# Patient Record
Sex: Female | Born: 1995 | Race: White | Hispanic: No | Marital: Single | Smoking: Never smoker
Health system: Southern US, Community
[De-identification: ages and names within clinical notes are randomized; demographics above are authoritative.]

## PROBLEM LIST (undated history)

## (undated) DIAGNOSIS — F329 Major depressive disorder, single episode, unspecified: Secondary | ICD-10-CM

## (undated) DIAGNOSIS — F32A Depression, unspecified: Secondary | ICD-10-CM

## (undated) HISTORY — DX: Major depressive disorder, single episode, unspecified: F32.9

## (undated) HISTORY — PX: WISDOM TOOTH EXTRACTION: SHX21

## (undated) HISTORY — DX: Depression, unspecified: F32.A

---

## 2017-06-01 ENCOUNTER — Encounter: Payer: Self-pay | Admitting: Obstetrics and Gynecology

## 2017-06-01 ENCOUNTER — Ambulatory Visit (INDEPENDENT_AMBULATORY_CARE_PROVIDER_SITE_OTHER): Payer: 59 | Admitting: Obstetrics and Gynecology

## 2017-06-01 ENCOUNTER — Other Ambulatory Visit (HOSPITAL_COMMUNITY)
Admission: RE | Admit: 2017-06-01 | Discharge: 2017-06-01 | Disposition: A | Discharge status: Home / Self Care | Payer: 59 | Source: Ambulatory Visit | Attending: Obstetrics and Gynecology | Admitting: Obstetrics and Gynecology

## 2017-06-01 VITALS — BP 118/60 | HR 100 | Resp 14 | Ht 62.5 in | Wt 118.0 lb

## 2017-06-01 DIAGNOSIS — Z01419 Encounter for gynecological examination (general) (routine) without abnormal findings: Secondary | ICD-10-CM

## 2017-06-01 DIAGNOSIS — Z23 Encounter for immunization: Secondary | ICD-10-CM | POA: Diagnosis not present

## 2017-06-01 DIAGNOSIS — N644 Mastodynia: Secondary | ICD-10-CM

## 2017-06-01 DIAGNOSIS — Z124 Encounter for screening for malignant neoplasm of cervix: Secondary | ICD-10-CM | POA: Insufficient documentation

## 2017-06-01 DIAGNOSIS — N6320 Unspecified lump in the left breast, unspecified quadrant: Secondary | ICD-10-CM

## 2017-06-01 DIAGNOSIS — Z113 Encounter for screening for infections with a predominantly sexual mode of transmission: Secondary | ICD-10-CM | POA: Diagnosis not present

## 2017-06-01 DIAGNOSIS — N6325 Unspecified lump in the left breast, overlapping quadrants: Secondary | ICD-10-CM

## 2017-06-01 DIAGNOSIS — Z0000 Encounter for general adult medical examination without abnormal findings: Secondary | ICD-10-CM | POA: Diagnosis not present

## 2017-06-01 NOTE — Progress Notes (Signed)
21 y.o. G0P0000 SingleCaucasianF here for annual exam.  She was previously on OCP's, did okay on it.  Period Cycle (Days): 28 Period Duration (Days): 4 days  Period Pattern: Regular Menstrual Flow: Light Menstrual Control: Tampon, Thin pad, Maxi pad Menstrual Control Change Freq (Hours): changes pad/ tampon 3 times a day  Dysmenorrhea: None  Sexually active, not currently. No dyspareunia.  She c/o a one month h/o intermittent pain in her left breast.   Patient's last menstrual period was 05/18/2017.          Sexually active: Yes.    The current method of family planning is none.    Exercising: Yes.    yoga and cardio  Smoker:  no  Health Maintenance: Pap:  Never TDaP:  2008 Gardasil: completed all 3    reports that she has never smoked. She has never used smokeless tobacco. She reports that she drinks about 1.8 - 3.0 oz of alcohol per week . She reports that she does not use drugs. 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  Past Medical History:  Diagnosis Date  . Depression     No past surgical history on file.  No current outpatient prescriptions on file.   No current facility-administered medications for this visit.     No family history on file.  Review of Systems  Constitutional: Negative.   HENT: Negative.   Eyes: Negative.   Respiratory: Negative.   Cardiovascular: Negative.   Gastrointestinal: Negative.   Endocrine: Negative.   Genitourinary: Negative.   Musculoskeletal: Negative.   Skin: Negative.   Allergic/Immunologic: Negative.   Neurological: Negative.   Psychiatric/Behavioral: Negative.     Exam:   BP 118/60 (BP Location: Right Arm, Patient Position: Sitting, Cuff Size: Normal)   Pulse 100   Resp 14   Ht 5' 2.5" (1.588 m)   Wt 118 lb (53.5 kg)   LMP 05/18/2017   BMI 21.24 kg/m   Weight change: @ Height:   Height: 5' 2.5" (158.8 cm)  Ht Readings from Last 3 Encounters:   06/01/17 5' 2.5" (1.588 m)    General appearance: alert, cooperative and appears stated age Head: Normocephalic, without obvious abnormality, atraumatic Neck: no adenopathy, supple, symmetrical, trachea midline and thyroid normal to inspection and palpation Lungs: clear to auscultation bilaterally Cardiovascular: regular rate and rhythm Breasts: in the left breast at 12 o'clock near the periphery is an increased area of nodulartity/lump. Otherwise normal exam Abdomen: soft, non-tender; non distended,  no masses,  no organomegaly Extremities: extremities normal, atraumatic, no cyanosis or edema Skin: Skin color, texture, turgor normal. No rashes or lesions Lymph nodes: Cervical, supraclavicular, and axillary nodes normal. No abnormal inguinal nodes palpated Neurologic: Grossly normal   Pelvic: External genitalia:  no lesions              Urethra:  normal appearing urethra with no masses, tenderness or lesions              Bartholins and Skenes: normal                 Vagina: normal appearing vagina with normal color and discharge, no lesions              Cervix: no lesions               Bimanual Exam:  Uterus:  normal size, contour, position, consistency, mobility, non-tender  Adnexa: no mass, fullness, tenderness               Rectovaginal: Confirms               Anus:  normal sphincter tone, no lesions  Chaperone was present for exam.  A:  Well Woman with normal exam  Left breast pain and nodularity at 12 o'clock    P:   She will call if she wants to start OCP's, if she call's will call in loestrin 1/20 (no contraindications, risks reviewed)  Discussed other options of contraception  Condoms  TDAP today  Screening labs   Screening STD  Pap with GC/CT  Ultrasound left breast 12 o'clock

## 2017-06-01 NOTE — Patient Instructions (Addendum)
EXERCISE AND DIET:  We recommended that you start or continue a regular exercise program for good health. Regular exercise means any activity that makes your heart beat faster and makes you sweat.  We recommend exercising at least 30 minutes per day at least 3 days a week, preferably 4 or 5.  We also recommend a diet low in fat and sugar.  Inactivity, poor dietary choices and obesity can cause diabetes, heart attack, stroke, and kidney damage, among others.    ALCOHOL AND SMOKING:  Women should limit their alcohol intake to no more than 7 drinks/beers/glasses of wine (combined, not each!) per week. Moderation of alcohol intake to this level decreases your risk of breast cancer and liver damage. And of course, no recreational drugs are part of a healthy lifestyle.  And absolutely no smoking or even second hand smoke. Most people know smoking can cause heart and lung diseases, but did you know it also contributes to weakening of your bones? Aging of your skin?  Yellowing of your teeth and nails?  CALCIUM AND VITAMIN D:  Adequate intake of calcium and Vitamin D are recommended.  The recommendations for exact amounts of these supplements seem to change often, but generally speaking 600 mg of calcium (either carbonate or citrate) and 800 units of Vitamin D per day seems prudent. Certain women may benefit from higher intake of Vitamin D.  If you are among these women, your doctor will have told you during your visit.    PAP SMEARS:  Pap smears, to check for cervical cancer or precancers,  have traditionally been done yearly, although recent scientific advances have shown that most women can have pap smears less often.  However, every woman still should have a physical exam from her gynecologist every year. It will include a breast check, inspection of the vulva and vagina to check for abnormal growths or skin changes, a visual exam of the cervix, and then an exam to evaluate the size and shape of the uterus and  ovaries.  And after 22 years of age, a rectal exam is indicated to check for rectal cancers. We will also provide age appropriate advice regarding health maintenance, like when you should have certain vaccines, screening for sexually transmitted diseases, bone density testing, colonoscopy, mammograms, etc.   MAMMOGRAMS:  All women over 22 years old should have a yearly mammogram. Many facilities now offer a "3D" mammogram, which may cost around $50 extra out of pocket. If possible,  we recommend you accept the option to have the 3D mammogram performed.  It both reduces the number of women who will be called back for extra views which then turn out to be normal, and it is better than the routine mammogram at detecting truly abnormal areas.    COLONOSCOPY:  Colonoscopy to screen for colon cancer is recommended for all women at age 22.  We know, you hate the idea of the prep.  We agree, BUT, having colon cancer and not knowing it is worse!!  Colon cancer so often starts as a polyp that can be seen and removed at colonscopy, which can quite literally save your life!  And if your first colonoscopy is normal and you have no family history of colon cancer, most women don't have to have it again for 10 years.  Once every ten years, you can do something that may end up saving your life, right?  We will be happy to help you get it scheduled when you are ready.    Be sure to check your insurance coverage so you understand how much it will cost.  It may be covered as a preventative service at no cost, but you should check your particular policy.      Breast Self-Awareness Breast self-awareness means being familiar with how your breasts look and feel. It involves checking your breasts regularly and reporting any changes to your health care provider. Practicing breast self-awareness is important. A change in your breasts can be a sign of a serious medical problem. Being familiar with how your breasts look and feel allows  you to find any problems early, when treatment is more likely to be successful. All women should practice breast self-awareness, including women who have had breast implants. How to do a breast self-exam One way to learn what is normal for your breasts and whether your breasts are changing is to do a breast self-exam. To do a breast self-exam: Look for Changes  1. Remove all the clothing above your waist. 2. Stand in front of a mirror in a room with good lighting. 3. Put your hands on your hips. 4. Push your hands firmly downward. 5. Compare your breasts in the mirror. Look for differences between them (asymmetry), such as: ? Differences in shape. ? Differences in size. ? Puckers, dips, and bumps in one breast and not the other. 6. Look at each breast for changes in your skin, such as: ? Redness. ? Scaly areas. 7. Look for changes in your nipples, such as: ? Discharge. ? Bleeding. ? Dimpling. ? Redness. ? A change in position. Feel for Changes  Carefully feel your breasts for lumps and changes. It is best to do this while lying on your back on the floor and again while sitting or standing in the shower or tub with soapy water on your skin. Feel each breast in the following way:  Place the arm on the side of the breast you are examining above your head.  Feel your breast with the other hand.  Start in the nipple area and make  inch (2 cm) overlapping circles to feel your breast. Use the pads of your three middle fingers to do this. Apply light pressure, then medium pressure, then firm pressure. The light pressure will allow you to feel the tissue closest to the skin. The medium pressure will allow you to feel the tissue that is a little deeper. The firm pressure will allow you to feel the tissue close to the ribs.  Continue the overlapping circles, moving downward over the breast until you feel your ribs below your breast.  Move one finger-width toward the center of the body.  Continue to use the  inch (2 cm) overlapping circles to feel your breast as you move slowly up toward your collarbone.  Continue the up and down exam using all three pressures until you reach your armpit.  Write Down What You Find  Write down what is normal for each breast and any changes that you find. Keep a written record with breast changes or normal findings for each breast. By writing this information down, you do not need to depend only on memory for size, tenderness, or location. Write down where you are in your menstrual cycle, if you are still menstruating. If you are having trouble noticing differences in your breasts, do not get discouraged. With time you will become more familiar with the variations in your breasts and more comfortable with the exam. How often should I examine my breasts? Examine   your breasts every month. If you are breastfeeding, the best time to examine your breasts is after a feeding or after using a breast pump. If you menstruate, the best time to examine your breasts is 5-7 days after your period is over. During your period, your breasts are lumpier, and it may be more difficult to notice changes. When should I see my health care provider? See your health care provider if you notice:  A change in shape or size of your breasts or nipples.  A change in the skin of your breast or nipples, such as a reddened or scaly area.  Unusual discharge from your nipples.  A lump or thick area that was not there before.  Pain in your breasts.  Anything that concerns you.  This information is not intended to replace advice given to you by your health care provider. Make sure you discuss any questions you have with your health care provider. Document Released: 08/16/2005 Document Revised: 01/22/2016 Document Reviewed: 07/06/2015 Elsevier Interactive Patient Education  2018 Elsevier Inc.  Oral Contraception Information Oral contraceptive pills (OCPs) are medicines  taken to prevent pregnancy. OCPs work by preventing the ovaries from releasing eggs. The hormones in OCPs also cause the cervical mucus to thicken, preventing the sperm from entering the uterus. The hormones also cause the uterine lining to become thin, not allowing a fertilized egg to attach to the inside of the uterus. OCPs are highly effective when taken exactly as prescribed. However, OCPs do not prevent sexually transmitted diseases (STDs). Safe sex practices, such as using condoms along with the pill, can help prevent STDs. Before taking the pill, you may have a physical exam and Pap test. Your health care provider may order blood tests. The health care provider will make sure you are a good candidate for oral contraception. Discuss with your health care provider the possible side effects of the OCP you may be prescribed. When starting an OCP, it can take 2 to 3 months for the body to adjust to the changes in hormone levels in your body. Types of oral contraception  The combination pill-This pill contains estrogen and progestin (synthetic progesterone) hormones. The combination pill comes in 21-day, 28-day, or 91-day packs. Some types of combination pills are meant to be taken continuously (365-day pills). With 21-day packs, you do not take pills for 7 days after the last pill. With 28-day packs, the pill is taken every day. The last 7 pills are without hormones. Certain types of pills have more than 21 hormone-containing pills. With 91-day packs, the first 84 pills contain both hormones, and the last 7 pills contain no hormones or contain estrogen only.  The minipill-This pill contains the progesterone hormone only. The pill is taken every day continuously. It is very important to take the pill at the same time each day. The minipill comes in packs of 28 pills. All 28 pills contain the hormone. Advantages of oral contraceptive pills  Decreases premenstrual symptoms.  Treats menstrual period  cramps.  Regulates the menstrual cycle.  Decreases a heavy menstrual flow.  May treatacne, depending on the type of pill.  Treats abnormal uterine bleeding.  Treats polycystic ovarian syndrome.  Treats endometriosis.  Can be used as emergency contraception. Things that can make oral contraceptive pills less effective OCPs can be less effective if:  You forget to take the pill at the same time every day.  You have a stomach or intestinal disease that lessens the absorption of the pill.    You take OCPs with other medicines that make OCPs less effective, such as antibiotics, certain HIV medicines, and some seizure medicines.  You take expired OCPs.  You forget to restart the pill on day 7, when using the packs of 21 pills.  Risks associated with oral contraceptive pills Oral contraceptive pills can sometimes cause side effects, such as:  Headache.  Nausea.  Breast tenderness.  Irregular bleeding or spotting.  Combination pills are also associated with a small increased risk of:  Blood clots.  Heart attack.  Stroke.  This information is not intended to replace advice given to you by your health care provider. Make sure you discuss any questions you have with your health care provider. Document Released: 11/06/2002 Document Revised: 01/22/2016 Document Reviewed: 02/04/2013 Elsevier Interactive Patient Education  2018 Elsevier Inc.  

## 2017-06-01 NOTE — Progress Notes (Signed)
Patient scheduled while in office for left breast US. Spoke with Fabby at [facility] Scheduled for 06/08/17 arriving at 7:50am for 8:10am appointment. Patient verbalizes understanding and is agreeable to date and time.

## 2017-06-02 LAB — CYTOLOGY - PAP
CHLAMYDIA, DNA PROBE: NEGATIVE
Diagnosis: NEGATIVE
NEISSERIA GONORRHEA: NEGATIVE

## 2017-06-02 LAB — CBC
HEMATOCRIT: 43.4 % (ref 34.0–46.6)
Hemoglobin: 14.3 g/dL (ref 11.1–15.9)
MCH: 30.4 pg (ref 26.6–33.0)
MCHC: 32.9 g/dL (ref 31.5–35.7)
MCV: 92 fL (ref 79–97)
Platelets: 285 10*3/uL (ref 150–379)
RBC: 4.7 x10E6/uL (ref 3.77–5.28)
RDW: 12.6 % (ref 12.3–15.4)
WBC: 6.7 10*3/uL (ref 3.4–10.8)

## 2017-06-02 LAB — COMPREHENSIVE METABOLIC PANEL
A/G RATIO: 2 (ref 1.2–2.2)
ALT: 10 IU/L (ref 0–32)
AST: 19 IU/L (ref 0–40)
Albumin: 5 g/dL (ref 3.5–5.5)
Alkaline Phosphatase: 56 IU/L (ref 39–117)
BUN/Creatinine Ratio: 12 (ref 9–23)
BUN: 8 mg/dL (ref 6–20)
Bilirubin Total: 0.7 mg/dL (ref 0.0–1.2)
CALCIUM: 10.2 mg/dL (ref 8.7–10.2)
CO2: 24 mmol/L (ref 20–29)
Chloride: 103 mmol/L (ref 96–106)
Creatinine, Ser: 0.69 mg/dL (ref 0.57–1.00)
GFR, EST AFRICAN AMERICAN: 143 mL/min/{1.73_m2} (ref >59)
GFR, EST NON AFRICAN AMERICAN: 124 mL/min/{1.73_m2} (ref >59)
GLOBULIN, TOTAL: 2.5 g/dL (ref 1.5–4.5)
Glucose: 98 mg/dL (ref 65–99)
POTASSIUM: 4.3 mmol/L (ref 3.5–5.2)
Sodium: 143 mmol/L (ref 134–144)
Total Protein: 7.5 g/dL (ref 6.0–8.5)

## 2017-06-02 LAB — LIPID PANEL
CHOL/HDL RATIO: 2 ratio (ref 0.0–4.4)
Cholesterol, Total: 172 mg/dL (ref 100–199)
HDL: 86 mg/dL (ref >39)
LDL Calculated: 77 mg/dL (ref 0–99)
TRIGLYCERIDES: 44 mg/dL (ref 0–149)
VLDL Cholesterol Cal: 9 mg/dL (ref 5–40)

## 2017-06-02 LAB — HEP, RPR, HIV PANEL
HEP B S AG: NEGATIVE
HIV SCREEN 4TH GENERATION: NONREACTIVE
RPR Ser Ql: NONREACTIVE

## 2017-06-02 LAB — HEPATITIS C ANTIBODY

## 2017-06-08 ENCOUNTER — Ambulatory Visit
Admission: RE | Admit: 2017-06-08 | Discharge: 2017-06-08 | Disposition: A | Discharge status: Home / Self Care | Payer: 59 | Source: Ambulatory Visit | Attending: Obstetrics and Gynecology | Admitting: Obstetrics and Gynecology

## 2017-06-08 DIAGNOSIS — N6325 Unspecified lump in the left breast, overlapping quadrants: Secondary | ICD-10-CM

## 2017-06-08 DIAGNOSIS — N6320 Unspecified lump in the left breast, unspecified quadrant: Principal | ICD-10-CM

## 2017-07-12 ENCOUNTER — Telehealth: Payer: Self-pay | Admitting: Obstetrics and Gynecology

## 2017-07-12 NOTE — Telephone Encounter (Signed)
Left patient a message to call back to reschedule a future appointment that was cancelled by the provider on 07/19/17 for a breast recheck with Dr. Jertson. °

## 2017-07-15 NOTE — Telephone Encounter (Signed)
Left patient a message to call back to reschedule a future appointment that was cancelled by the provider on 07/19/17 for a breast recheck with Dr. Oscar LaJertson.

## 2017-07-19 ENCOUNTER — Ambulatory Visit: Payer: Self-pay | Admitting: Obstetrics and Gynecology

## 2017-07-27 NOTE — Telephone Encounter (Signed)
Please send the patient a letter recommending a f/u breast exam to document stability. Then close the encounter.

## 2017-07-27 NOTE — Telephone Encounter (Signed)
Left patient a message to call back to reschedule a future appointment that was cancelled by the provideron 07/19/17 for a breast recheck with Dr. Oscar LaJertson. Routing to provider for review as patient did not answer the call today and we have not heard back from her to reschedule. Please advise?

## 2017-07-28 NOTE — Telephone Encounter (Signed)
Sending to nursing supervisor , Kennon RoundsSally, RN  for letter.-eh

## 2017-08-29 ENCOUNTER — Encounter: Payer: Self-pay | Admitting: *Deleted

## 2017-08-29 NOTE — Telephone Encounter (Signed)
Letter to your desk for review. Will mail tomorrow if no patient response.

## 2017-08-29 NOTE — Telephone Encounter (Signed)
Call to patient. Per ROI can leave message on voice mail, number confirms  "Sammy."  Left message calling to follow-up on recommended breast recheck that we have been unable to reach her to schedule. Would like to review importance of appointment and assist with scheduling. [facility] back to [facility], Materials engineerclinical supervisor.

## 2017-08-31 NOTE — Telephone Encounter (Signed)
Letter reviewed and signed by Dr Oscar LaJertson. Mailed certified and regular [place] mail.

## 2017-09-05 ENCOUNTER — Other Ambulatory Visit: Payer: Self-pay

## 2017-09-05 ENCOUNTER — Encounter: Payer: Self-pay | Admitting: Obstetrics and Gynecology

## 2017-09-05 ENCOUNTER — Ambulatory Visit (INDEPENDENT_AMBULATORY_CARE_PROVIDER_SITE_OTHER): Payer: 59 | Admitting: Obstetrics and Gynecology

## 2017-09-05 VITALS — BP 110/60 | HR 88 | Resp 14 | Wt 116.0 lb

## 2017-09-05 DIAGNOSIS — N644 Mastodynia: Secondary | ICD-10-CM | POA: Diagnosis not present

## 2017-09-05 DIAGNOSIS — N6320 Unspecified lump in the left breast, unspecified quadrant: Secondary | ICD-10-CM | POA: Diagnosis not present

## 2017-09-05 DIAGNOSIS — N6325 Unspecified lump in the left breast, overlapping quadrants: Secondary | ICD-10-CM

## 2017-09-05 NOTE — Progress Notes (Signed)
GYNECOLOGY  VISIT   HPI: 22 y.o.   Single  Caucasian  female   G0P0000 with Patient's last menstrual period was 08/17/2017.   here for left breast recheck. At her annual exam in 10/18 she was noted to have increased nodularity in the left breast at 12 o'clock near the periphery. She was having pain in this region. 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    She denies any changes in her left breast exam. She continues to have sporadic pain in the left breast that lasts for a few seconds, no change.     GYNECOLOGIC HISTORY: Patient's last menstrual period was 08/17/2017. Contraception:none Menopausal hormone therapy: none         OB History    Gravida Para Term Preterm AB Living   0 0 0 0 0 0   SAB TAB Ectopic Multiple Live Births   0 0 0 0 0         There are no active problems to display for this patient.   Past Medical History:  Diagnosis Date  . Depression     History reviewed. No pertinent surgical history.  No current outpatient medications on file.   No current facility-administered medications for this visit.      ALLERGIES: Patient has no known allergies.  History reviewed. No pertinent family history.  Social History   Socioeconomic History  . Marital status: Single    Spouse name: Not on file  . Number of children: Not on file  . Years of education: Not on file  . Highest education level: Not on file  Social Needs  . Financial resource strain: Not on file  . Food insecurity - worry: Not on file  . Food insecurity - inability: Not on file  . Transportation needs - medical: Not on file  . Transportation needs - non-medical: Not on file  Occupational History  . Not on file  Tobacco Use  . Smoking status: Never Smoker  . Smokeless tobacco: Never Used  Substance and Sexual Activity  . Alcohol use: Yes    Alcohol/week: 1.8 - 3.0 oz    Types: 3 - 5 Standard drinks or equivalent per week  . Drug use: No  .  Sexual activity: Yes    Partners: Male    Birth control/protection: None  Other Topics Concern  . Not on file  Social History Narrative  . Not on file    Review of Systems  Constitutional: Negative.   HENT: Negative.   Eyes: Negative.   Respiratory: Negative.   Cardiovascular: Negative.   Gastrointestinal: Negative.   Genitourinary: Negative.   Musculoskeletal: Negative.   Skin: Negative.   Neurological: Negative.   Endo/Heme/Allergies: Negative.   Psychiatric/Behavioral: Negative.     PHYSICAL EXAMINATION:    BP 110/60 (BP Location: Right Arm, Patient Position: Sitting, Cuff Size: Normal)   Pulse 88   Resp 14   Wt 116 lb (52.6 kg)   LMP 08/17/2017   BMI 20.88 kg/m     General appearance: alert, cooperative and appears stated age Breasts: in the left breast at 12 o'clock near the periphery is an increased area of nodularity, irregular, approximately 1 cm (smaller than described on imaging), mobile. The nodular area is tender to palpation. No other breast lumps. No skin changes. No axillary or supraclavicular adenopathy.   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

## 2017-09-05 NOTE — Patient Instructions (Signed)

## 2017-12-01 ENCOUNTER — Other Ambulatory Visit: Payer: Self-pay | Admitting: Obstetrics and Gynecology

## 2017-12-01 DIAGNOSIS — N6320 Unspecified lump in the left breast, unspecified quadrant: Secondary | ICD-10-CM

## 2017-12-13 ENCOUNTER — Other Ambulatory Visit: Payer: Self-pay | Admitting: Obstetrics and Gynecology

## 2017-12-13 ENCOUNTER — Ambulatory Visit
Admission: RE | Admit: 2017-12-13 | Discharge: 2017-12-13 | Disposition: A | Discharge status: Home / Self Care | Payer: 59 | Source: Ambulatory Visit | Attending: Obstetrics and Gynecology | Admitting: Obstetrics and Gynecology

## 2017-12-13 DIAGNOSIS — N6320 Unspecified lump in the left breast, unspecified quadrant: Secondary | ICD-10-CM

## 2018-02-21 ENCOUNTER — Telehealth: Payer: Self-pay | Admitting: Obstetrics and Gynecology

## 2018-02-21 NOTE — Telephone Encounter (Signed)
Spoke with patient. Patient states that she spoke with Dr.Jertson at her last visit 09/05/17 about starting on OCP. Would like to start at this time. Requesting rx to be sent to pharmacy on file. Last aex 06/01/17. Next aex 06/15/18.

## 2018-02-21 NOTE — Telephone Encounter (Signed)
Left message to call [facility] at [phone]. 

## 2018-02-21 NOTE — Telephone Encounter (Signed)
Patient returning call.

## 2018-02-21 NOTE — Telephone Encounter (Signed)
Patient is requesting oral contraception options.

## 2018-02-22 MED ORDER — NORETHIN ACE-ETH ESTRAD-FE 1-20 MG-MCG PO TABS: 1.0000 | ORAL_TABLET | Freq: Every day | ORAL | 1 refills | Status: DC

## 2018-02-22 NOTE — Telephone Encounter (Signed)
Will call in 3 packs with one refill

## 2018-02-23 NOTE — Telephone Encounter (Signed)
Spoke with patient. Message given as seen below from Dr.Jertson. Patient verbalizes understanding. Encounter closed. 

## 2018-02-23 NOTE — Telephone Encounter (Signed)
Left message to call Raja Liska at [phone]. 

## 2018-04-04 ENCOUNTER — Telehealth: Payer: Self-pay | Admitting: Obstetrics and Gynecology

## 2018-04-04 NOTE — Telephone Encounter (Signed)
Patient has some questions for a nurse about her birth control.  °

## 2018-04-04 NOTE — Telephone Encounter (Signed)
Patient calling and states she started Junel FE about two weeks ago. Feeling like she is having increased mood swings and feels more "moody."  Reports she did not remember feeling this way with her previous pills. Does not remember type of pills she was on previously.   Discussed hormonal changes with the new start of OCP. Denies thoughts of SI/HI.  Advised to continue on current pack as directed and if not improved after this month to call back for office visit with Dr. Oscar LaJertson. Pt advised typically takes 3 months for hormone adjustment after starting combined OCP's. Pt verbalizes understanding and will call back if symptoms worsen at any time or do not improve after 3 months.   Routing to Dr. Oscar LaJertson to review and advise prn. Will close encounter.

## 2018-04-05 ENCOUNTER — Telehealth: Payer: Self-pay | Admitting: Obstetrics and Gynecology

## 2018-04-05 NOTE — Telephone Encounter (Signed)
Left message to call Jiovany Scheffel at [phone].  

## 2018-04-05 NOTE — Telephone Encounter (Signed)
Patient has more questions regarding her birth control. Patient spoke with triage yesterday.

## 2018-04-05 NOTE — Telephone Encounter (Signed)
Spoke with patient. Patient called office on 8/6, states she was unable to go into full detail during her conversation. States she is concerned with changes in mood since starting OCP 2wks ago. Reports "sudden" and "strong" changes in mood in last 5 days. Feels depressed and unmotivated, not her normal. Denies SI/HI. Denies any other changes.   Patient states she is unsure if she should  continue OCP or discuss alternatives. OV scheduled for 04/11/18 at 10:30 am with Dr. Reyne DumasJerston. Declined appt for 8/8.   Advised will review recommendations for continuing current OCP with Dr. Oscar LaJertson and return call.    Dr. Oscar LaJertson -please advise.

## 2018-04-05 NOTE — Telephone Encounter (Signed)
Spoke with patient, advised as seen below per Dr. Oscar LaJertson. Patient called earlier to move OV to 8/8 at 8am, will discuss further at OV. No Rx sent. Patient thankful for return call.  Routing to provider for final review. Patient is agreeable to disposition. Will close encounter.

## 2018-04-05 NOTE — Telephone Encounter (Signed)
If her symptoms are severe and she feels they are from the pill she can stop the pill, use condoms and we can discuss other options of contraception at her visit next week. We can also try a different pill. You can switch to Yaz (warn her slightly higher risk of clots, but is supposed to be helpful for mood). If she switches she can just start a new pack today or tomorrow.

## 2018-04-06 ENCOUNTER — Ambulatory Visit (INDEPENDENT_AMBULATORY_CARE_PROVIDER_SITE_OTHER): Payer: 59 | Admitting: Obstetrics and Gynecology

## 2018-04-06 ENCOUNTER — Encounter: Payer: Self-pay | Admitting: Obstetrics and Gynecology

## 2018-04-06 ENCOUNTER — Other Ambulatory Visit: Payer: Self-pay

## 2018-04-06 VITALS — BP 108/60 | HR 80 | Wt 116.0 lb

## 2018-04-06 DIAGNOSIS — F32A Depression, unspecified: Secondary | ICD-10-CM

## 2018-04-06 DIAGNOSIS — F329 Major depressive disorder, single episode, unspecified: Secondary | ICD-10-CM | POA: Diagnosis not present

## 2018-04-06 DIAGNOSIS — Z3009 Encounter for other general counseling and advice on contraception: Secondary | ICD-10-CM | POA: Diagnosis not present

## 2018-04-06 NOTE — Progress Notes (Signed)
GYNECOLOGY  VISIT   HPI: 22 y.o.   Single  Caucasian  female   G0P0000 with Patient's last menstrual period was 03/17/2018 (exact date).   here to discuss changing OCP due to depression symptoms since starting. She is currently ending her first pack of pills. She started feeling really down 4-5 days ago. She has some increased stress and anxiety. She woke up Monday morning and felt like she couldn't go to work. Has been unmotivated, decreased appetite, not expected. She has good support. She has a Veterinary surgeoncounselor and will see her today. No thoughts of hurting herself or others. Prior to the pill she has had PMS, but never felt like this. She is feeling a little better.  She has been on medication for depression and anxiety in the past, wasn't on it for long. Made her tired, went off of it, maybe it worked some. She tried lexapro, made her tired, then was on a small dose of lexapro and wellbutrin.   GYNECOLOGIC HISTORY: Patient's last menstrual period was 03/17/2018 (exact date). Contraception:OCP Menopausal hormone therapy: none        OB History    Gravida  0   Para  0   Term  0   Preterm  0   AB  0   Living  0     SAB  0   TAB  0   Ectopic  0   Multiple  0   Live Births  0              There are no active problems to display for this patient.   Past Medical History:  Diagnosis Date  . Depression     No past surgical history on file.  Current Outpatient Medications  Medication Sig Dispense Refill  . norethindrone-ethinyl estradiol (JUNEL FE,GILDESS FE,LOESTRIN FE) 1-20 MG-MCG tablet Take 1 tablet by mouth daily. 3 Package 1   No current facility-administered medications for this visit.      ALLERGIES: Patient has no known allergies.  No family history on file.  Social History   Socioeconomic History  . Marital status: Single    Spouse name: Not on file  . Number of children: Not on file  . Years of education: Not on file  . Highest education level:  Not on file  Occupational History  . Not on file  Social Needs  . Financial resource strain: Not on file  . Food insecurity:    Worry: Not on file    Inability: Not on file  . Transportation needs:    Medical: Not on file    Non-medical: Not on file  Tobacco Use  . Smoking status: Never Smoker  . Smokeless tobacco: Never Used  Substance and Sexual Activity  . Alcohol use: Yes    Alcohol/week: 3.0 - 5.0 standard drinks    Types: 3 - 5 Standard drinks or equivalent per week  . Drug use: No  . Sexual activity: Yes    Partners: Male    Birth control/protection: Pill  Lifestyle  . Physical activity:    Days per week: Not on file    Minutes per session: Not on file  . Stress: Not on file  Relationships  . Social connections:    Talks on phone: Not on file    Gets together: Not on file    Attends religious service: Not on file    Active member of club or organization: Not on file    Attends meetings of  clubs or organizations: Not on file    Relationship status: Not on file  . Intimate partner violence:    Fear of current or ex partner: Not on file    Emotionally abused: Not on file    Physically abused: Not on file    Forced sexual activity: Not on file  Other Topics Concern  . Not on file  Social History Narrative  . Not on file    Review of Systems  Constitutional: Negative.   HENT: Negative.   Eyes: Negative.   Respiratory: Negative.   Cardiovascular: Negative.   Gastrointestinal: Negative.   Genitourinary: Negative.   Musculoskeletal: Negative.   Skin: Negative.   Neurological: Negative.   Endo/Heme/Allergies: Negative.   Psychiatric/Behavioral:       Depression with OCP    PHYSICAL EXAMINATION:    BP 108/60 (BP Location: Right Arm, Patient Position: Sitting)   Pulse 80   Wt 116 lb (52.6 kg)   LMP 03/17/2018 (Exact Date)   BMI 20.88 kg/m     General appearance: alert, cooperative and appears stated age  ASSESSMENT Depression, just started this  week at the end of her first pack of pills. She is already starting to feel a little better (just finishing active pills). Prior h/o depression    PLAN F/U with her counselor Discussed the option of taking antidepressants if needed I'm not sure the pill is causing her symptoms given that her symptoms started several weeks in and are improving already She will see how she feels on the placebo week and then after restarting the pill Discussed the option of trying a different pill, trying another form of contraception. All options for contraception discussed Information given on the Palau and mirena IUD (her twin sister has the skyla and is doing well)   An After Visit Summary was printed and given to the patient.  ~15 minutes face to face time of which over 50% was spent in counseling.

## 2018-04-11 ENCOUNTER — Ambulatory Visit: Payer: Self-pay | Admitting: Obstetrics and Gynecology

## 2018-06-12 NOTE — Progress Notes (Deleted)
22 y.o. G0P0000 Single White or Caucasian Not Hispanic or Latino female here for annual exam.      No LMP recorded.          Sexually active: {yes no:314532}  The current method of family planning is {contraception:315051}.    Exercising: {yes no:314532}  {types:19826} Smoker:  {YES [id number]  Health Maintenance: Pap:  06/01/2017 negative History of abnormal Pap:  no TDaP:  06/01/2017 Gardasil: Completed all 3   reports that she has never smoked. She has never used smokeless tobacco. She reports that she drinks about 3.0 - 5.0 standard drinks of alcohol per week. She reports that she does not use drugs.  Past Medical History:  Diagnosis Date  . Depression     No past surgical history on file.  Current Outpatient Medications  Medication Sig Dispense Refill  . norethindrone-ethinyl estradiol (JUNEL FE,GILDESS FE,LOESTRIN FE) 1-20 MG-MCG tablet Take 1 tablet by mouth daily. 3 Package 1   No current facility-administered medications for this visit.     No family history on file.  Review of Systems  Exam:   There were no vitals taken for this visit.  Weight change: @WEIGHTCHANGE @ Height:      Ht Readings from Last 3 Encounters:  06/01/17 5' 2.5" (1.588 m)    General appearance: alert, cooperative and appears stated age Head: Normocephalic, without obvious abnormality, atraumatic Neck: no adenopathy, supple, symmetrical, trachea midline and thyroid {CHL AMB PHY EX THYROID NORM DEFAULT:[phone]::"normal to inspection and palpation"} Lungs: clear to auscultation bilaterally Cardiovascular: regular rate and rhythm Breasts: {Exam; breast:13139::"normal appearance, no masses or tenderness"} Abdomen: soft, non-tender; non distended,  no masses,  no organomegaly Extremities: extremities normal, atraumatic, no cyanosis or edema Skin: Skin color, texture, turgor normal. No rashes or lesions Lymph nodes: Cervical, supraclavicular, and axillary nodes normal. No abnormal inguinal  nodes palpated Neurologic: Grossly normal   Pelvic: External genitalia:  no lesions              Urethra:  normal appearing urethra with no masses, tenderness or lesions              Bartholins and Skenes: normal                 Vagina: normal appearing vagina with normal color and discharge, no lesions              Cervix: {CHL AMB PHY EX CERVIX NORM DEFAULT:[phone]::"no lesions"}               Bimanual Exam:  Uterus:  {CHL AMB PHY EX UTERUS NORM DEFAULT:[phone]::"normal size, contour, position, consistency, mobility, non-tender"}              Adnexa: {CHL AMB PHY EX ADNEXA NO MASS [phone]::"no mass, fullness, tenderness"}               Rectovaginal: Confirms               Anus:  normal sphincter tone, no lesions  Chaperone was present for exam.  A:  Well Woman with normal exam  P:

## 2018-06-14 ENCOUNTER — Inpatient Hospital Stay: Admission: RE | Admit: 2018-06-14 | Payer: 59 | Source: Ambulatory Visit

## 2018-06-15 ENCOUNTER — Ambulatory Visit: Payer: 59 | Admitting: Obstetrics and Gynecology

## 2018-06-27 NOTE — Progress Notes (Signed)
22 y.o. G0P0000 Single White or Caucasian Not Hispanic or Latino female here for annual exam.  She was seen in 8/19 with some depression, wasn't sure if the pill was causing it. Currently depression is better. Has a Veterinary surgeon.  She is considering changing to a Pal[place] or a mirena. Leaning toward the [place]. She has a twin sister who is doing well with the skyla.  Period Cycle (Days): 28 Period Duration (Days): 3-4 days Period Pattern: Regular Menstrual Flow: Light Menstrual Control: Tampon, Thin pad Menstrual Control Change Freq (Hours): changes tampon/pad every 4 hours Dysmenorrhea: None  Sexually active, same partner x 5 months. Mostly using condoms. No dyspareunia.   No LMP recorded.  Patient is unsure of LMP start date.        Sexually active: Yes.    The current method of family planning is OCP (estrogen/progesterone).    Exercising: Yes.    running Smoker:  no  Health Maintenance: Pap:  06/01/2017 negative Abnormal pap smear: No TDaP:  06/01/2017 Gardasil: completed all 3    reports that she has never smoked. She has never used smokeless tobacco. She reports that she drinks about 3.0 standard drinks of alcohol per week. She reports that she does not use drugs. 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  Past Medical History:  Diagnosis Date  . Depression     History reviewed. No pertinent surgical history.  Current Outpatient Medications  Medication Sig Dispense Refill  . norethindrone-ethinyl estradiol (JUNEL FE,GILDESS FE,LOESTRIN FE) 1-20 MG-MCG tablet Take 1 tablet by mouth daily. 3 Package 1   No current facility-administered medications for this visit.     History reviewed. No pertinent family history.  Review of Systems  Constitutional: Negative.   HENT: Negative.   Eyes: Negative.   Respiratory: Negative.   Cardiovascular: Negative.   Gastrointestinal: Negative.   Endocrine: Negative.    Genitourinary: Negative.   Musculoskeletal: Negative.   Skin: Negative.   Allergic/Immunologic: Negative.   Neurological: Negative.   Hematological: Negative.   Psychiatric/Behavioral: Negative.     Exam:   BP 110/60 (BP Location: Right Arm, Patient Position: Sitting, Cuff Size: Normal)   Pulse 76   Ht 5\' 4"  (1.626 m)   Wt 119 lb (54 kg)   BMI 20.43 kg/m   Weight change: @WEIGHTCHANGE @ Height:   Height: 5\' 4"  (162.6 cm)  Ht Readings from Last 3 Encounters:  06/28/18 5\' 4"  (1.626 m)  06/01/17 5' 2.5" (1.588 m)    General appearance: alert, cooperative and appears stated age Head: Normocephalic, without obvious abnormality, atraumatic Neck: no adenopathy, supple, symmetrical, trachea midline and thyroid normal to inspection and palpation Lungs: clear to auscultation bilaterally Cardiovascular: regular rate and rhythm Breasts: normal appearance, no masses or tenderness Abdomen: soft, non-tender; non distended,  no masses,  no organomegaly Extremities: extremities normal, atraumatic, no cyanosis or edema Skin: Skin color, texture, turgor normal. No rashes or lesions Lymph nodes: Cervical, supraclavicular, and axillary nodes normal. No abnormal inguinal nodes palpated Neurologic: Grossly normal   Pelvic: External genitalia:  no lesions              Urethra:  normal appearing urethra with no masses, tenderness or lesions              Bartholins and Skenes: normal                 Vagina: normal appearing vagina with normal color and discharge,  no lesions              Cervix: no lesions               Bimanual Exam:  Uterus:  normal size, contour, position, consistency, mobility, non-tender, anteverted              Adnexa: no mass, fullness, tenderness               Rectovaginal: Confirms               Anus:  normal sphincter tone, no lesions  Chaperone was present for exam.  A:  Well Woman with normal exam  P:   Considering the kyleena IUD  Screening GC/CT, declines other  STD testing  Declines screening labs  Continue OCP's  Discussed breast self exam  Discussed calcium and vit D intake

## 2018-06-28 ENCOUNTER — Encounter: Payer: Self-pay | Admitting: Obstetrics and Gynecology

## 2018-06-28 ENCOUNTER — Ambulatory Visit (INDEPENDENT_AMBULATORY_CARE_PROVIDER_SITE_OTHER): Payer: 59 | Admitting: Obstetrics and Gynecology

## 2018-06-28 VITALS — BP 110/60 | HR 76 | Ht 64.0 in | Wt 119.0 lb

## 2018-06-28 DIAGNOSIS — Z113 Encounter for screening for infections with a predominantly sexual mode of transmission: Secondary | ICD-10-CM

## 2018-06-28 DIAGNOSIS — Z3041 Encounter for surveillance of contraceptive pills: Secondary | ICD-10-CM | POA: Diagnosis not present

## 2018-06-28 DIAGNOSIS — Z01419 Encounter for gynecological examination (general) (routine) without abnormal findings: Secondary | ICD-10-CM

## 2018-06-28 MED ORDER — NORETHIN ACE-ETH ESTRAD-FE 1-20 MG-MCG PO TABS: 1.0000 | ORAL_TABLET | Freq: Every day | ORAL | 3 refills | Status: DC

## 2018-06-28 NOTE — Addendum Note (Signed)
Addended by: Tobi Bastos on: 06/28/2018 03:51 PM   Modules accepted: Orders

## 2018-06-28 NOTE — Patient Instructions (Signed)
EXERCISE AND DIET:  We recommended that you start or continue a regular exercise program for good health. Regular exercise means any activity that makes your heart beat faster and makes you sweat.  We recommend exercising at least 30 minutes per day at least 3 days a week, preferably 4 or 5.  We also recommend a diet low in fat and sugar.  Inactivity, poor dietary choices and obesity can cause diabetes, heart attack, stroke, and kidney damage, among others.    ALCOHOL AND SMOKING:  Women should limit their alcohol intake to no more than 7 drinks/beers/glasses of wine (combined, not each!) per week. Moderation of alcohol intake to this level decreases your risk of breast cancer and liver damage. And of course, no recreational drugs are part of a healthy lifestyle.  And absolutely no smoking or even second hand smoke. Most people know smoking can cause heart and lung diseases, but did you know it also contributes to weakening of your bones? Aging of your skin?  Yellowing of your teeth and nails?  CALCIUM AND VITAMIN D:  Adequate intake of calcium and Vitamin D are recommended.  The recommendations for exact amounts of these supplements seem to change often, but generally speaking 600 mg of calcium (either carbonate or citrate) and 800 units of Vitamin D per day seems prudent. Certain women may benefit from higher intake of Vitamin D.  If you are among these women, your doctor will have told you during your visit.    PAP SMEARS:  Pap smears, to check for cervical cancer or precancers,  have traditionally been done yearly, although recent scientific advances have shown that most women can have pap smears less often.  However, every woman still should have a physical exam from her gynecologist every year. It will include a breast check, inspection of the vulva and vagina to check for abnormal growths or skin changes, a visual exam of the cervix, and then an exam to evaluate the size and shape of the uterus and  ovaries.  And after 23 years of age, a rectal exam is indicated to check for rectal cancers. We will also provide age appropriate advice regarding health maintenance, like when you should have certain vaccines, screening for sexually transmitted diseases, bone density testing, colonoscopy, mammograms, etc.   MAMMOGRAMS:  All women over 40 years old should have a yearly mammogram. Many facilities now offer a "3D" mammogram, which may cost around $50 extra out of pocket. If possible,  we recommend you accept the option to have the 3D mammogram performed.  It both reduces the number of women who will be called back for extra views which then turn out to be normal, and it is better than the routine mammogram at detecting truly abnormal areas.    COLONOSCOPY:  Colonoscopy to screen for colon cancer is recommended for all women at age 50.  We know, you hate the idea of the prep.  We agree, BUT, having colon cancer and not knowing it is worse!!  Colon cancer so often starts as a polyp that can be seen and removed at colonscopy, which can quite literally save your life!  And if your first colonoscopy is normal and you have no family history of colon cancer, most women don't have to have it again for 10 years.  Once every ten years, you can do something that may end up saving your life, right?  We will be happy to help you get it scheduled when you are ready.    Be sure to check your insurance coverage so you understand how much it will cost.  It may be covered as a preventative service at no cost, but you should check your particular policy.      Breast Self-Awareness Breast self-awareness means being familiar with how your breasts look and feel. It involves checking your breasts regularly and reporting any changes to your health care provider. Practicing breast self-awareness is important. A change in your breasts can be a sign of a serious medical problem. Being familiar with how your breasts look and feel allows  you to find any problems early, when treatment is more likely to be successful. All women should practice breast self-awareness, including women who have had breast implants. How to do a breast self-exam One way to learn what is normal for your breasts and whether your breasts are changing is to do a breast self-exam. To do a breast self-exam: Look for Changes  1. Remove all the clothing above your waist. 2. Stand in front of a mirror in a room with good lighting. 3. Put your hands on your hips. 4. Push your hands firmly downward. 5. Compare your breasts in the mirror. Look for differences between them (asymmetry), such as: ? Differences in shape. ? Differences in size. ? Puckers, dips, and bumps in one breast and not the other. 6. Look at each breast for changes in your skin, such as: ? Redness. ? Scaly areas. 7. Look for changes in your nipples, such as: ? Discharge. ? Bleeding. ? Dimpling. ? Redness. ? A change in position. Feel for Changes  Carefully feel your breasts for lumps and changes. It is best to do this while lying on your back on the floor and again while sitting or standing in the shower or tub with soapy water on your skin. Feel each breast in the following way:  Place the arm on the side of the breast you are examining above your head.  Feel your breast with the other hand.  Start in the nipple area and make  inch (2 cm) overlapping circles to feel your breast. Use the pads of your three middle fingers to do this. Apply light pressure, then medium pressure, then firm pressure. The light pressure will allow you to feel the tissue closest to the skin. The medium pressure will allow you to feel the tissue that is a little deeper. The firm pressure will allow you to feel the tissue close to the ribs.  Continue the overlapping circles, moving downward over the breast until you feel your ribs below your breast.  Move one finger-width toward the center of the body.  Continue to use the  inch (2 cm) overlapping circles to feel your breast as you move slowly up toward your collarbone.  Continue the up and down exam using all three pressures until you reach your armpit.  Write Down What You Find  Write down what is normal for each breast and any changes that you find. Keep a written record with breast changes or normal findings for each breast. By writing this information down, you do not need to depend only on memory for size, tenderness, or location. Write down where you are in your menstrual cycle, if you are still menstruating. If you are having trouble noticing differences in your breasts, do not get discouraged. With time you will become more familiar with the variations in your breasts and more comfortable with the exam. How often should I examine my breasts? Examine   your breasts every month. If you are breastfeeding, the best time to examine your breasts is after a feeding or after using a breast pump. If you menstruate, the best time to examine your breasts is 5-7 days after your period is over. During your period, your breasts are lumpier, and it may be more difficult to notice changes. When should I see my health care provider? See your health care provider if you notice:  A change in shape or size of your breasts or nipples.  A change in the skin of your breast or nipples, such as a reddened or scaly area.  Unusual discharge from your nipples.  A lump or thick area that was not there before.  Pain in your breasts.  Anything that concerns you.  This information is not intended to replace advice given to you by your health care provider. Make sure you discuss any questions you have with your health care provider. Document Released: 08/16/2005 Document Revised: 01/22/2016 Document Reviewed: 07/06/2015 Elsevier Interactive Patient Education  2018 [employer]  

## 2018-06-30 LAB — GC/CHLAMYDIA PROBE AMP
Chlamydia trachomatis, NAA: NEGATIVE
Neisseria gonorrhoeae by PCR: NEGATIVE

## 2018-07-04 ENCOUNTER — Ambulatory Visit
Admission: RE | Admit: 2018-07-04 | Discharge: 2018-07-04 | Disposition: A | Discharge status: Home / Self Care | Payer: 59 | Source: Ambulatory Visit | Attending: Obstetrics and Gynecology | Admitting: Obstetrics and Gynecology

## 2018-07-04 DIAGNOSIS — N6320 Unspecified lump in the left breast, unspecified quadrant: Secondary | ICD-10-CM

## 2018-10-04 ENCOUNTER — Ambulatory Visit (INDEPENDENT_AMBULATORY_CARE_PROVIDER_SITE_OTHER): Payer: PRIVATE HEALTH INSURANCE | Admitting: Psychiatry

## 2018-10-04 ENCOUNTER — Encounter (HOSPITAL_COMMUNITY): Payer: Self-pay | Admitting: Psychiatry

## 2018-10-04 VITALS — BP 126/72 | HR 78 | Ht 63.0 in | Wt 116.0 lb

## 2018-10-04 DIAGNOSIS — F411 Generalized anxiety disorder: Secondary | ICD-10-CM

## 2018-10-04 MED ORDER — BUSPIRONE HCL 10 MG PO TABS
10.0000 mg | ORAL_TABLET | Freq: Two times a day (BID) | ORAL | 0 refills | Status: DC
Start: 2018-10-04 — End: 2018-11-02

## 2018-10-04 NOTE — Patient Instructions (Signed)
Plan:  A trial of buspirone (Buspar) for anxiety.  Start 1/2 tablet twice daily with food for 6-8 days then start taking a whole 10 mg tablet twice daily.

## 2018-10-04 NOTE — Progress Notes (Signed)
Psychiatric Initial Adult Assessment   Patient Identification: Charlene Cole MRN:  [id number] Date of Evaluation:  10/04/2018 Referral Source: Kandace Blitz LCSW Chief Complaint:  Anxiety, feelings of derealization Visit Diagnosis:    ICD-10-CM   1. GAD (generalized anxiety disorder) F41.1     History of Present Illness:  23 yo single female with sx of anxiety, excessive worrying, ruminations, difficulty with decision making and feelings of derealization which started in her 1st yeasr in college. Anxiety level fluctuates with the level of environmental stress. She also reports middle insomnia: no problems falling asleep but will wake up often in the middle of nigh but again is able to fall asleep w/o any pharmacological help. 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 Patient has been in therapy with Ms. Jimmye Norman since July 2019 with clear benefits although anxiety and derealization episodes still continue. Charlene Cole denies having hx of depression, mania, hallucinations/delusions. She does not abuse street drugs or alcohol. She denies having hx of being abused. She has tried once a medication for anxiety - escitalopram 5 mg but it made her excessively tired. Wellbutrin was therefore added and even though fatigue improved anxiety did not although she continued on it for a few months. She has no hx of inpatient psychiatric hospitalizations and no family psychiatric history.  Associated Signs/Symptoms: Depression Symptoms:  depressed mood, anxiety, (Hypo) Manic Symptoms:  none Anxiety Symptoms:  Excessive Worry, Psychotic Symptoms:  none PTSD Symptoms: Negative  Past Psychiatric History: see above  Previous Psychotropic Medications: Yes   Substance Abuse History in the last 12 months:  No.  Consequences of Substance Abuse: Negative  Past Medical History:  Past Medical History:   Diagnosis Date  . Depression     Past Surgical History:  Procedure Laterality Date  . WISDOM TOOTH EXTRACTION      Family Psychiatric History: None  Family History: No family history on file.  Social History:   Social History   Socioeconomic History  . Marital status: Single    Spouse name: Not on file  . Number of children: 0  . Years of education: Not on file  . Highest education level: Bachelor's degree (e.g., BA, AB, BS)  Occupational History  . Not on file  Social Needs  . Financial resource strain: Not hard at all  . Food insecurity:    Worry: Never true    Inability: Never true  . Transportation needs:    Medical: Not on file    Non-medical: Not on file  Tobacco Use  . Smoking status: Never Smoker  . Smokeless tobacco: Never Used  Substance and Sexual Activity  . Alcohol use: Yes    Alcohol/week: 3.0 standard drinks    Types: 3 Standard drinks or equivalent per week  . Drug use: No  . Sexual activity: Yes    Partners: Male    Birth control/protection: Pill  Lifestyle  . Physical activity:    Days per week: Not on file    Minutes per session: Not on file  . Stress: Not on file  Relationships  . Social connections:    Talks on phone: Not on file    Gets together: Not on file    Attends religious service: Not on file    Active member of club or organization: Not on file    Attends meetings of clubs or organizations: Not on file  Relationship status: Not on file  Other Topics Concern  . Not on file  Social History Narrative  . Not on file    Additional Social History: Single, has a twin sister. College graduate in [employer], works for [employer] center in administration.  Allergies:  No Known Allergies  Metabolic Disorder Labs: No results found for: HGBA1C, MPG No results found for: PROLACTIN Lab Results  Component Value Date   CHOL 172 06/01/2017   TRIG 44 06/01/2017   HDL 86 06/01/2017   CHOLHDL 2.0 06/01/2017   LDLCALC  77 06/01/2017   No results found for: TSH  Therapeutic Level Labs: No results found for: LITHIUM No results found for: CBMZ No results found for: VALPROATE  Current Medications: Current Outpatient Medications  Medication Sig Dispense Refill  . norethindrone-ethinyl estradiol (JUNEL FE,GILDESS FE,LOESTRIN FE) 1-20 MG-MCG tablet Take 1 tablet by mouth daily. 3 Package 3  . busPIRone (BUSPAR) 10 MG tablet Take 1 tablet (10 mg total) by mouth 2 (two) times daily for 30 days. 60 tablet 0   No current facility-administered medications for this visit.     Musculoskeletal: Strength & Muscle Tone: within normal limits Gait & Station: normal Patient leans: N/A  Psychiatric Specialty Exam: Review of Systems  Constitutional: Negative.   HENT: Negative.   Eyes: Negative.   Respiratory: Negative.   Cardiovascular: Negative.   Gastrointestinal: Negative.   Genitourinary: Negative.   Musculoskeletal: Negative.   Skin: Negative.   Neurological: Negative.   Endo/Heme/Allergies: Negative.   Psychiatric/Behavioral: The patient is nervous/anxious.     Blood pressure 126/72, pulse 78, height 5' 3"  (1.6 m), weight 116 lb (52.6 kg), SpO2 98 %.Body mass index is 20.55 kg/m.  General Appearance: Casual and Neat  Eye Contact:  Good  Speech:  Clear and Coherent  Volume:  Normal  Mood:  Anxious  Affect:  Full Range  Thought Process:  Coherent and Goal Directed  Orientation:  Full (Time, Place, and Person)  Thought Content:  Frequent feelings of derealization  Suicidal Thoughts:  No  Homicidal Thoughts:  No  Memory:  Immediate;   Good Recent;   Good Remote;   Good  Judgement:  Good  Insight:  Good  Psychomotor Activity:  Normal  Concentration:  Concentration: Good and Attention Span: Good  Recall:  Good  Fund of Knowledge:Good  Language: Good  Akathisia:  Negative  Handed:  Right  AIMS (if indicated):  not done  Assets:  Communication Skills Desire for  [place] Talents/Skills Vocational/Educational  ADL's:  Intact  Cognition: WNL  Sleep:  Fair   Screenings:   Assessment and Plan: 23 yo single female with few year hx of excessive worrying, ruminating, difficulty with making decisions, disturbed sleep and feelings of derealization. These symptoms started when she was in 1 st year in college admittedly a stressful time for her. Although they improved some with therapy current high level of stress associated with her job appears to trigger them again. She had tried a low dose of escitalopram w/o much benefit in the past. While she reports obsessing it does not seem to really reflect obsessive thoughts but admittedly excessive worrying about making decisions in life. No compulsions are described either. Patient has no hx of being abuse. She is physically healthy and very insightful.  Dx impression :  Generalized anxiety disorder   Possible Depersonalization/derealization disorder although it is not clear if criterion C is met (significant distress or impairment in function -  patient appears fairly well adjusted to these feelings).  We have discussed option of trying another SSRI (sertratine) or buspirone and agreed to try the latter first. Charlene Cole will start on 5 mg bid for 6 days then increase dose to 10 mg bid. We will consider increasing dose further if she tolerates it well and has some benefit from it. The plan was discussed with patient. I spend 60 minutes in direct face to face clinical contact with the patient and devoted approximately 50% of this time to explanation of diagnosis, discussion of treatment options and med education. She will return to clinic in 4 weeks.   Stephanie Acre, MD 2/5/202010:47 AM

## 2018-10-24 ENCOUNTER — Other Ambulatory Visit (HOSPITAL_COMMUNITY): Payer: Self-pay | Admitting: Psychiatry

## 2018-11-01 ENCOUNTER — Ambulatory Visit (HOSPITAL_COMMUNITY): Payer: Self-pay | Admitting: Psychiatry

## 2018-11-01 ENCOUNTER — Telehealth (HOSPITAL_COMMUNITY): Payer: Self-pay

## 2018-11-01 NOTE — Telephone Encounter (Signed)
Medication refill request - Pt called requesting a one time refill of Buspar.  Stated she was trying  to get  her insurance worked out so had to cancel today's appointment and should be rescheduling soon when insurance issue fixed but will need a refill until then of Buspar.

## 2018-11-02 ENCOUNTER — Other Ambulatory Visit (HOSPITAL_COMMUNITY): Payer: Self-pay | Admitting: Psychiatry

## 2018-11-02 MED ORDER — BUSPIRONE HCL 10 MG PO TABS: 10.0000 mg | ORAL_TABLET | Freq: Two times a day (BID) | ORAL | 0 refills | Status: AC

## 2018-11-02 NOTE — Telephone Encounter (Signed)
Reordered.  OP

## 2018-11-02 NOTE — Telephone Encounter (Signed)
Medication management - message left for patient Dr. Hinton Dyer had sent in a new Buspar order for her to her [facility] on [address] as requested.

## 2018-12-19 ENCOUNTER — Other Ambulatory Visit (HOSPITAL_COMMUNITY): Payer: Self-pay

## 2018-12-19 MED ORDER — BUSPIRONE HCL 10 MG PO TABS: 10.0000 mg | ORAL_TABLET | Freq: Two times a day (BID) | ORAL | 0 refills | Status: DC

## 2018-12-28 ENCOUNTER — Other Ambulatory Visit (HOSPITAL_COMMUNITY): Payer: Self-pay | Admitting: Psychiatry

## 2018-12-28 MED ORDER — BUSPIRONE HCL 10 MG PO TABS: 10.0000 mg | ORAL_TABLET | Freq: Two times a day (BID) | ORAL | 0 refills | Status: AC

## 2019-01-25 IMAGING — US ULTRASOUND LEFT BREAST LIMITED
1 series · 6 of 6 positions shown · non-contrast
Comparison: June 08, 2017 and December 13, 2017

CLINICAL DATA: One year follow-up of a probable fibroadenoma in the
left breast 12 o'clock position. The mass is palpable in the patient
believes that it feels unchanged.

EXAM:
ULTRASOUND OF THE LEFT BREAST

[Series 1: ultrasound left breast limited · 0.04mm/px · 6 of 6 slices shown]
[im 1/6]
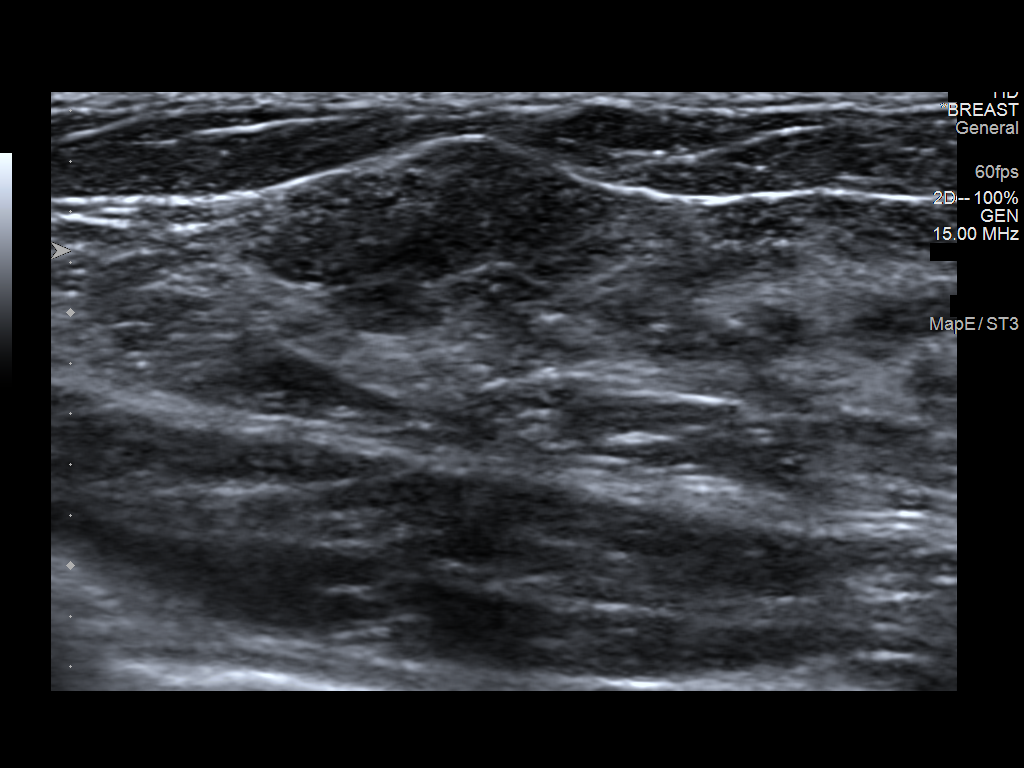
[im 2/6]
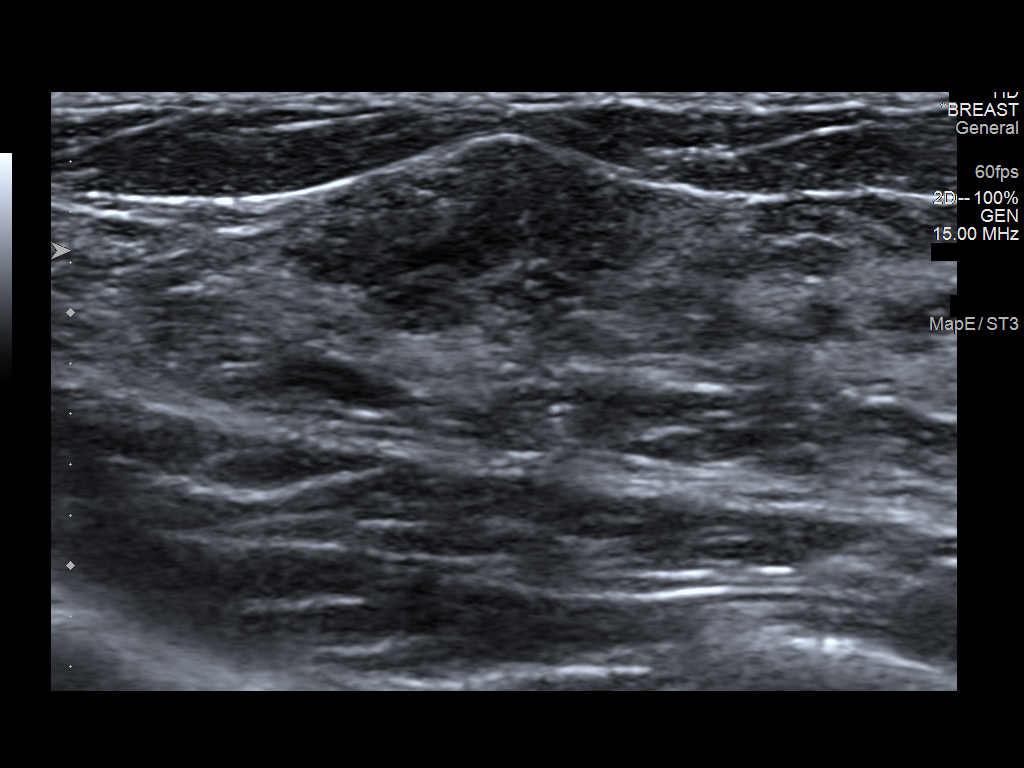
[im 3/6]
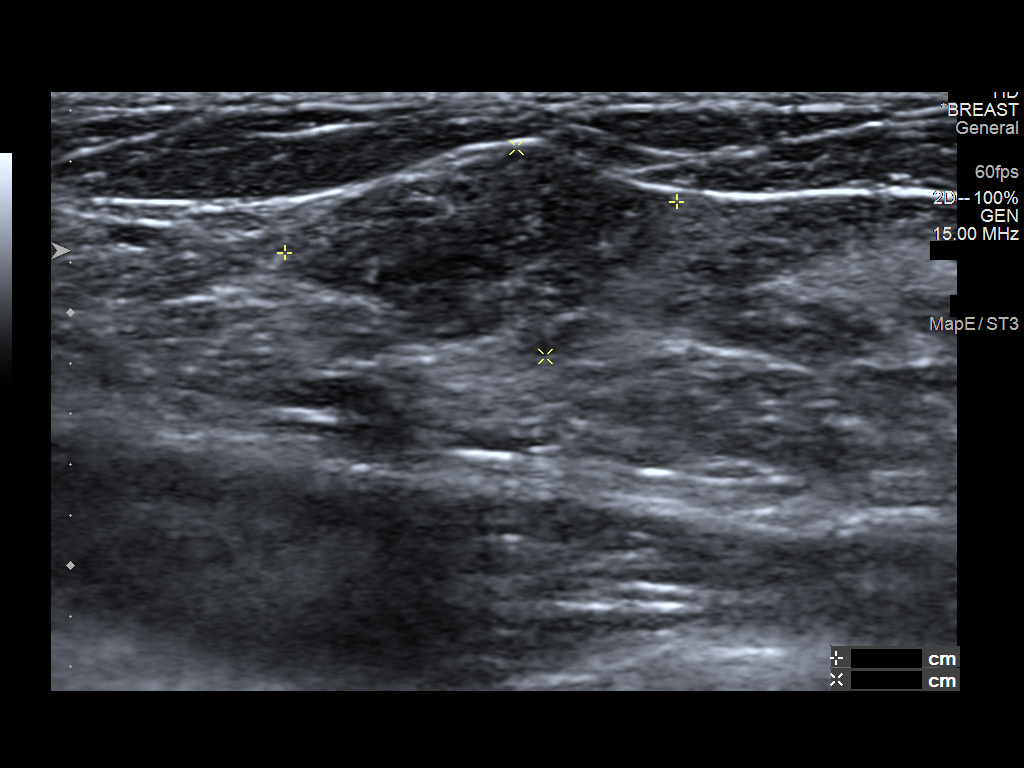
[im 4/6]
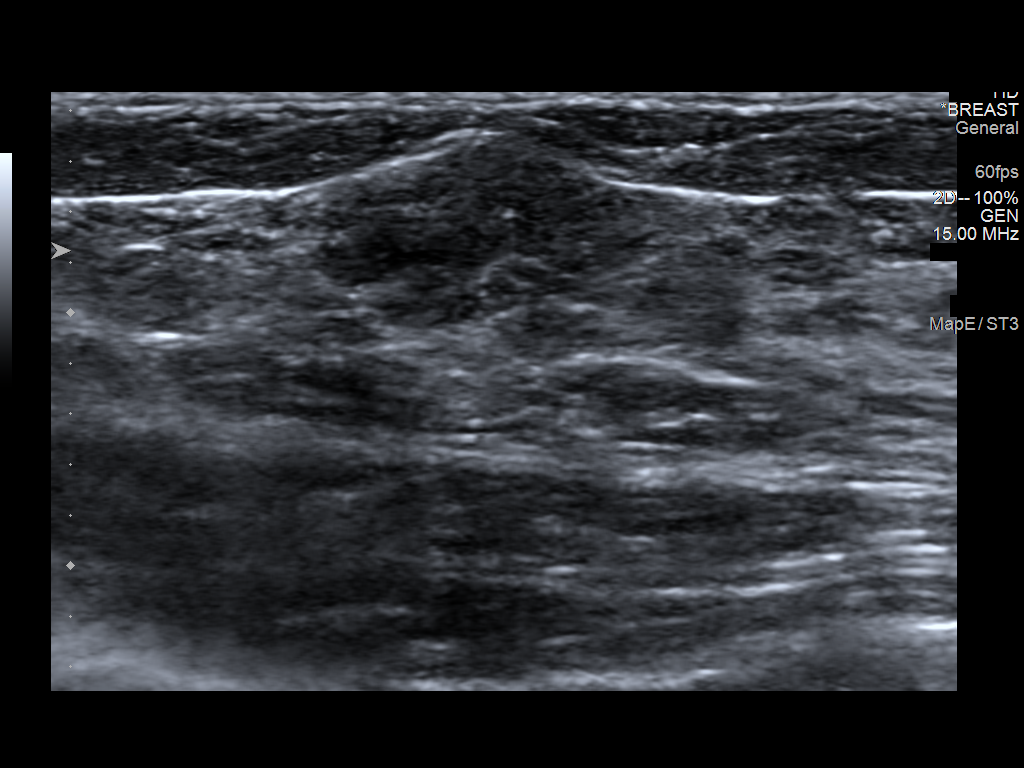
[im 5/6]
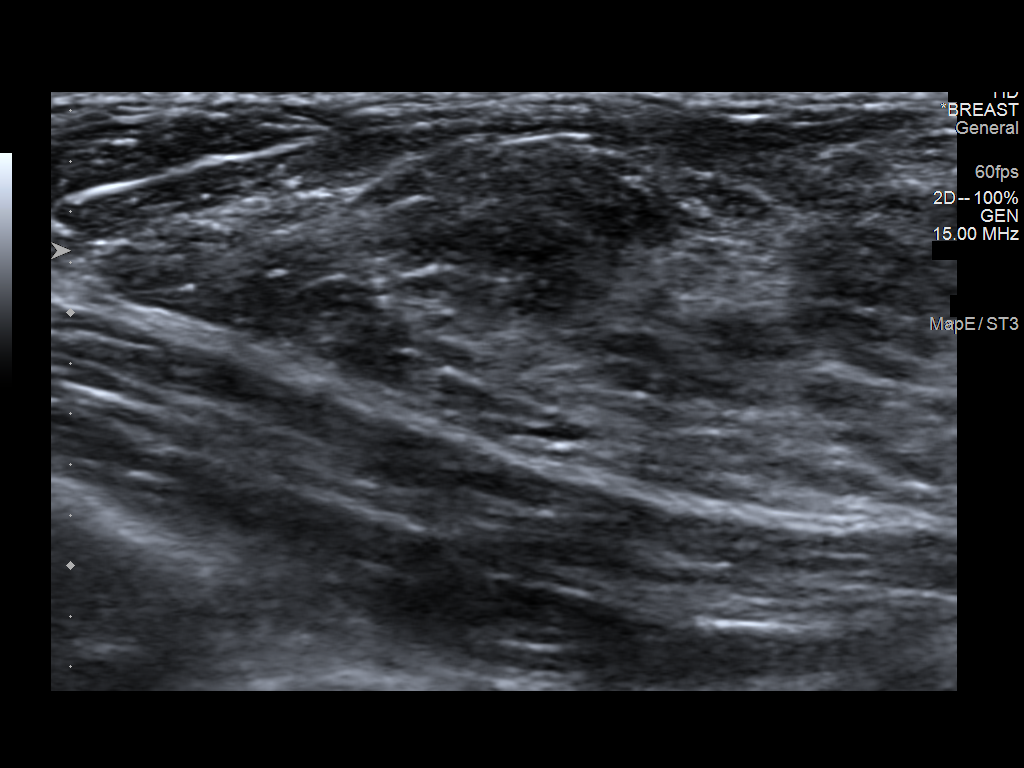
[im 6/6]
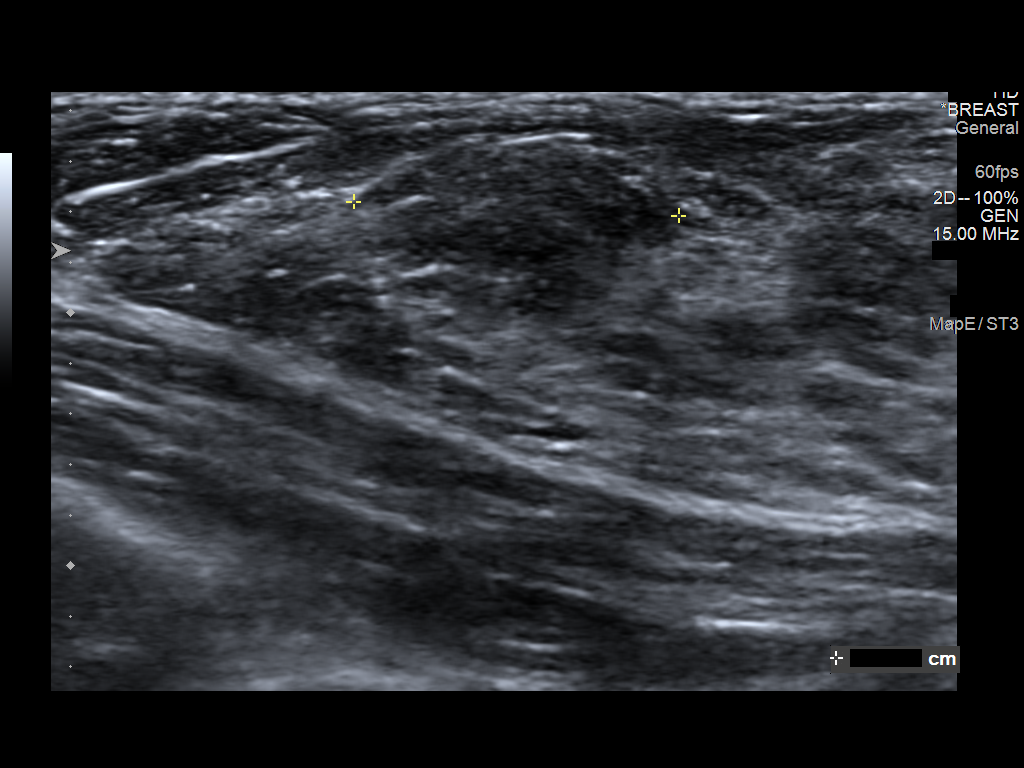

[6 of 6 positions shown; findings below may reference images not displayed]

FINDINGS: Targeted ultrasound is performed, showing a slightly hypoechoic oval
parallel gently lobulated and circumscribed mass with internal
echogenic bands at 12 o'clock position 4 cm from the nipple. The
mass measures 1.6 x 0.8 x 1.3 cm, stable.
IMPRESSION: Stable appearing probable fibroadenoma left breast.

RECOMMENDATION:
Left breast ultrasound is recommended in 1 year, in July 2019 to
complete a 2 year follow-up.

I have discussed the findings and recommendations with the patient.
Results were also provided in writing at the conclusion of the
visit. If applicable, a reminder letter will be sent to the patient
regarding the next appointment.

BI-RADS CATEGORY  3: Probably benign.

## 2019-02-22 ENCOUNTER — Other Ambulatory Visit: Payer: Self-pay

## 2019-02-22 ENCOUNTER — Ambulatory Visit (INDEPENDENT_AMBULATORY_CARE_PROVIDER_SITE_OTHER): Payer: PRIVATE HEALTH INSURANCE | Admitting: Psychiatry

## 2019-02-22 DIAGNOSIS — F411 Generalized anxiety disorder: Secondary | ICD-10-CM | POA: Diagnosis not present

## 2019-02-22 MED ORDER — BUSPIRONE HCL 15 MG PO TABS: 15.0000 mg | ORAL_TABLET | Freq: Two times a day (BID) | ORAL | 2 refills | Status: DC

## 2019-02-22 NOTE — Progress Notes (Signed)
BH MD/PA/NP OP Progress Note  02/22/2019 8:49 AM Charlene Cole  MRN:  [id number] Interview was conducted by phone and I verified that I was speaking with the correct person using two identifiers. I discussed the limitations of evaluation and management by telemedicine and  the availability of in person appointments. Patient expressed understanding and agreed to proceed.  Chief Complaint: Anxiety  HPI: 22 yo single female with few year hx of excessive worrying, ruminating, difficulty with making decisions, disturbed sleep and feelings of derealization. These symptoms started when she was in 1 st year in college admittedly a stressful time for her. Although they improved some with therapy current high level of stress associated with her job appears to trigger them again. She had tried a low dose of escitalopram w/o much benefit in the past. While she reports obsessing it does not seem to really reflect obsessive thoughts but admittedly excessive worrying about making decisions in life. No compulsions are described either. Patient has no hx of being abuse. She is physically healthy and very insightful. We have discussed option of trying another SSRI (sertratine) or buspirone and agreed to try the latter first. Charlene Cole has now been for  Close to 5 months on buspirone 10 mg bid. Overall stressful situation with COVID, she goes to work but [employer] in not likely to open soon contribute to ongoing anxiety. There is no depression, sleep and appetite are good. . Visit Diagnosis:    ICD-10-CM   1. GAD (generalized anxiety disorder)  F41.1     Past Psychiatric History: Please see intake H&P.  Past Medical History:  Past Medical History:  Diagnosis Date  . Depression     Past Surgical History:  Procedure Laterality Date  . WISDOM TOOTH EXTRACTION      Family Psychiatric History: None  Family History: No family history on file.  Social History:  Social History   Socioeconomic  History  . Marital status: Single    Spouse name: Not on file  . Number of children: 0  . Years of education: Not on file  . Highest education level: Bachelor's degree (e.g., BA, AB, BS)  Occupational History  . Not on file  Social Needs  . Financial resource strain: Not hard at all  . Food insecurity    Worry: Never true    Inability: Never true  . Transportation needs    Medical: Not on file    Non-medical: Not on file  Tobacco Use  . Smoking status: Never Smoker  . Smokeless tobacco: Never Used  Substance and Sexual Activity  . Alcohol use: Yes    Alcohol/week: 3.0 standard drinks    Types: 3 Standard drinks or equivalent per week  . Drug use: No  . Sexual activity: Yes    Partners: Male    Birth control/protection: Pill  Lifestyle  . Physical activity    Days per week: Not on file    Minutes per session: Not on file  . Stress: Not on file  Relationships  . Social Herbalist on phone: Not on file    Gets together: Not on file    Attends religious service: Not on file    Active member of club or organization: Not on file    Attends meetings of clubs or organizations: Not on file    Relationship status: Not on file  Other Topics Concern  . Not on file  Social History Narrative  . Not on file  Allergies: No Known Allergies  Metabolic Disorder Labs: No results found for: HGBA1C, MPG No results found for: PROLACTIN Lab Results  Component Value Date   CHOL 172 06/01/2017   TRIG 44 06/01/2017   HDL 86 06/01/2017   CHOLHDL 2.0 06/01/2017   LDLCALC 77 06/01/2017   No results found for: TSH  Therapeutic Level Labs: No results found for: LITHIUM No results found for: VALPROATE No components found for:  CBMZ  Current Medications: Current Outpatient Medications  Medication Sig Dispense Refill  . busPIRone (BUSPAR) 15 MG tablet Take 1 tablet (15 mg total) by mouth 2 (two) times daily. 60 tablet 2  . norethindrone-ethinyl estradiol (JUNEL  FE,GILDESS FE,LOESTRIN FE) 1-20 MG-MCG tablet Take 1 tablet by mouth daily. 3 Package 3   No current facility-administered medications for this visit.    Psychiatric Specialty Exam: Review of Systems  Psychiatric/Behavioral: The patient is nervous/anxious.   All other systems reviewed and are negative.   There were no vitals taken for this visit.There is no height or weight on file to calculate BMI.  General Appearance: NA  Eye Contact:  NA  Speech:  Clear and Coherent  Volume:  Normal  Mood:  Anxious  Affect:  NA  Thought Process:  Goal Directed  Orientation:  Full (Time, Place, and Person)  Thought Content: Logical   Suicidal Thoughts:  No  Homicidal Thoughts:  No  Memory:  Immediate;   Good Recent;   Good Remote;   Good  Judgement:  Good  Insight:  Good  Psychomotor Activity:  NA  Concentration:  Concentration: Good  Recall:  Good  Fund of Knowledge: Good  Language: Good  Akathisia:  Negative  Handed:  Right  AIMS (if indicated): not done  Assets:  Communication Skills Desire for Improvement Financial Resources/Insurance Housing Leisure Time Physical Health Social Support Talents/Skills Vocational/Educational  ADL's:  Intact  Cognition: WNL  Sleep:  Good   Assessment and Plan: 23 yo single female with few year hx of excessive worrying, ruminating, difficulty with making decisions, disturbed sleep and feelings of derealization. These symptoms started when she was in 1 st year in college admittedly a stressful time for her. Although they improved some with therapy current high level of stress associated with her job appears to trigger them again. She had tried a low dose of escitalopram w/o much benefit in the past. While she reports obsessing it does not seem to really reflect obsessive thoughts but admittedly excessive worrying about making decisions in life. No compulsions are described either. Patient has no hx of being abuse. She is physically healthy and very  insightful. We have discussed option of trying another SSRI (fluoxetine or sertratine) or buspirone and agreed to try the latter first. Charlene Cole has now been for close to 5 months on buspirone 10 mg bid. Overall stressful situation with COVID, she goes to work but [employer] in not likely to open soon contribute to ongoing anxiety. There is no depression, sleep and appetite are good. We will first try to see if increasing dose of buspirone to 15 mg bid will not further help with anxiety. The plan was discussed with patient who had an opportunity to ask questions and these were all answered. Next appointment in 2 months.    Magdalene Patricialgierd A Charlene Scholten, MD 02/22/2019, 8:49 AM

## 2019-03-24 ENCOUNTER — Other Ambulatory Visit (HOSPITAL_COMMUNITY): Payer: Self-pay | Admitting: Psychiatry

## 2019-04-24 ENCOUNTER — Ambulatory Visit (INDEPENDENT_AMBULATORY_CARE_PROVIDER_SITE_OTHER): Payer: PRIVATE HEALTH INSURANCE | Admitting: Psychiatry

## 2019-04-24 ENCOUNTER — Other Ambulatory Visit: Payer: Self-pay

## 2019-04-24 DIAGNOSIS — F411 Generalized anxiety disorder: Secondary | ICD-10-CM

## 2019-04-24 DIAGNOSIS — F422 Mixed obsessional thoughts and acts: Secondary | ICD-10-CM | POA: Diagnosis not present

## 2019-04-24 DIAGNOSIS — F429 Obsessive-compulsive disorder, unspecified: Secondary | ICD-10-CM | POA: Insufficient documentation

## 2019-04-24 MED ORDER — FLUOXETINE HCL 20 MG PO TABS: ORAL_TABLET | ORAL | 0 refills | Status: DC

## 2019-04-24 NOTE — Progress Notes (Signed)
BH MD/PA/NP OP Progress Note  04/24/2019 8:58 AM Charlene Cole  MRN:  [id number] Interview was conducted by phone and I verified that I was speaking with the correct person using two identifiers. I discussed the limitations of evaluation and management by telemedicine and  the availability of in person appointments. Patient expressed understanding and agreed to proceed.  Chief Complaint: Anxiety, dizziness  HPI: 23 yo single female with few year hx of excessive worrying, ruminating, difficulty with making decisions, disturbed sleep and feelings of derealization. These symptoms started when she was in 1 st year in college admittedly a stressful time for her. Although they improved some with therapy current high level of stress associated with her job appears to trigger them again. She had tried a low dose of escitalopram w/o much benefit in the past. While she reports obsessing and now also admits to engaging in compulsive gestures (eg touching objects few times to decrease anxiety) which does suggest a mild form of OCD.  Patient has no hx of being abuse. She is physically healthy and very insightful. We have discussed option of trying another SSRI (fluoxetine or sertratine) or buspirone and agreed to try the latter first. Charlene Cole has now been for 7 months on buspirone but reports that , although it helps some with anxiety it makes her dizzy (did not mention that during our previous session). She also is in psychotherapy. 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 There is no depression, sleep and appetite are good.    Visit Diagnosis:    ICD-10-CM   1. GAD (generalized anxiety disorder)  F41.1   2. Mixed obsessional thoughts and acts  F42.2     Past Psychiatric History: Please see intake H&P.  Past Medical History:  Past Medical History:  Diagnosis Date  . Depression     Past Surgical  History:  Procedure Laterality Date  . WISDOM TOOTH EXTRACTION      Family Psychiatric History: None.  Family History: No family history on file.  Social History:  Social History   Socioeconomic History  . Marital status: Single    Spouse name: Not on file  . Number of children: 0  . Years of education: Not on file  . Highest education level: Bachelor's degree (e.g., BA, AB, BS)  Occupational History  . Not on file  Social Needs  . Financial resource strain: Not hard at all  . Food insecurity    Worry: Never true    Inability: Never true  . Transportation needs    Medical: Not on file    Non-medical: Not on file  Tobacco Use  . Smoking status: Never Smoker  . Smokeless tobacco: Never Used  Substance and Sexual Activity  . Alcohol use: Yes    Alcohol/week: 3.0 standard drinks    Types: 3 Standard drinks or equivalent per week  . Drug use: No  . Sexual activity: Yes    Partners: Male    Birth control/protection: Pill  Lifestyle  . Physical activity    Days per week: Not on file    Minutes per session: Not on file  . Stress: Not on file  Relationships  . Social Musician on phone: Not on file    Gets together: Not on file    Attends religious service: Not on file    Active member of club or organization: Not on file  Attends meetings of clubs or organizations: Not on file    Relationship status: Not on file  Other Topics Concern  . Not on file  Social History Narrative  . Not on file    Allergies: No Known Allergies  Metabolic Disorder Labs: No results found for: HGBA1C, MPG No results found for: PROLACTIN Lab Results  Component Value Date   CHOL 172 06/01/2017   TRIG 44 06/01/2017   HDL 86 06/01/2017   CHOLHDL 2.0 06/01/2017   LDLCALC 77 06/01/2017   No results found for: TSH  Therapeutic Level Labs: No results found for: LITHIUM No results found for: VALPROATE No components found for:  CBMZ  Current Medications: Current  Outpatient Medications  Medication Sig Dispense Refill  . FLUoxetine (PROZAC) 20 MG tablet Take 1 tablet (20 mg total) by mouth daily for 14 days, THEN 2 tablets (40 mg total) daily for 28 days. 70 tablet 0  . norethindrone-ethinyl estradiol (JUNEL FE,GILDESS FE,LOESTRIN FE) 1-20 MG-MCG tablet Take 1 tablet by mouth daily. 3 Package 3   No current facility-administered medications for this visit.      Psychiatric Specialty Exam: Review of Systems  Neurological: Positive for dizziness.  Psychiatric/Behavioral: The patient is nervous/anxious.   All other systems reviewed and are negative.   There were no vitals taken for this visit.There is no height or weight on file to calculate BMI.  General Appearance: NA  Eye Contact:  NA  Speech:  Clear and Coherent and Normal Rate  Volume:  Normal  Mood:  Anxious  Affect:  NA  Thought Process:  Goal Directed and Linear  Orientation:  Full (Time, Place, and Person)  Thought Content: Obsessions and Rumination   Suicidal Thoughts:  No  Homicidal Thoughts:  No  Memory:  Immediate;   Good Recent;   Good Remote;   Good  Judgement:  Good  Insight:  Good  Psychomotor Activity:  NA  Concentration:  Concentration: Good and Attention Span: Good  Recall:  Good  Fund of Knowledge: Good  Language: Good  Akathisia:  Negative  Handed:  Right  AIMS (if indicated): not done  Assets:  Communication Skills Desire for Improvement Financial Resources/Insurance Housing Intimacy Physical Health Social Support Talents/Skills  ADL's:  Intact  Cognition: WNL  Sleep:  Good    Assessment and Plan: 23 yo single female with few year hx of excessive worrying, ruminating, difficulty with making decisions, disturbed sleep and feelings of derealization. These symptoms started when she was in 1 st year in college admittedly a stressful time for her. Although they improved some with therapy current high level of stress associated with her job appears to trigger  them again. She had tried a low dose of escitalopram w/o much benefit in the past. While she reports obsessing and now also admits to engaging in compulsive gestures (eg touching objects few times to decrease anxiety) which does suggest a mild form of OCD.  Patient has no hx of being abuse. She is physically healthy and very insightful. We have discussed option of trying another SSRI (fluoxetine or sertratine) or buspirone and agreed to try the latter first. Charlene Cole has now been for 7 months on buspirone but reports that , although it helps some with anxiety it makes her dizzy (did not mention that during our previous session). She also is in psychotherapy. 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 There is  no depression, sleep and appetite are good.   Plan: we will taper buspirone off - 7.5 mg bid x 1 week then stop. She will then start fluoxetine 20 mg daily (or 10 mg if she has marked GI adverse effects) for 2 weeks then increase dose to 40 mg. Charlene Cole may need even higher dose to achieve sufficient control of obsessions/compulsions. The plan was discussed with patient who had an opportunity to ask questions and these were all answered. Next appointment in 5 weeks. I have spend 25 min in phone consultation with the patient.    Stephanie Acre, MD 04/24/2019, 8:58 AM

## 2019-05-17 ENCOUNTER — Other Ambulatory Visit: Payer: Self-pay | Admitting: Obstetrics and Gynecology

## 2019-05-18 NOTE — Telephone Encounter (Signed)
Medication refill request: JUNEL FE Last AEX:  06/28/18 JJ Next AEX: 07/04/19 Last MMG (if hormonal medication request): 07/04/18 Left Breast US -- BIRADS 3:Probably benign Refill authorized: Please advise; Order pended #3 packs w/0 refills if authorized

## 2019-05-21 ENCOUNTER — Other Ambulatory Visit (HOSPITAL_COMMUNITY): Payer: Self-pay | Admitting: Psychiatry

## 2019-05-29 ENCOUNTER — Ambulatory Visit (HOSPITAL_COMMUNITY): Payer: 59 | Admitting: Psychiatry

## 2019-07-03 NOTE — Progress Notes (Signed)
23 y.o. G0P0000 Single White or Caucasian Not Hispanic or Latino female here for annual exam.   Sexually active, same partner. No dyspareunia. Doing well on OCP's. Cycle q month x 5-6 days. Saturates a regular tampon 2-3 x a day. No BTB, no cramps. A couple of times she has skipped the placebo and her cycle with some BTB.  She was out of her pills for a week, less bloating, less tired off of it. She is still considering the IUD.  Period Cycle (Days): 25 Period Duration (Days): 5-6 days Period Pattern: Regular Menstrual Flow: Light Menstrual Control: Thin pad, Panty liner Menstrual Control Change Freq (Hours): every 4 hours Dysmenorrhea: None  Lmp: 06/18/2019          Sexually active: Yes.    The current method of family planning is OCP (estrogen/progesterone).    Exercising: Yes.    Runner Smoker:  no  Health Maintenance: Pap:06/01/2017 negative Abnormal pap smear: No TDaP:06/01/2017 Gardasil:completed all 3    reports that she has never smoked. She has never used smokeless tobacco. She reports current alcohol use of about 3.0 standard drinks of alcohol per week. She reports that she does not use drugs. 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 Considering moving to [place] with her boyfriend, that is where her family is.   She went to school for performing arts at [employer].   Past Medical History:  Diagnosis Date  . Depression     Past Surgical History:  Procedure Laterality Date  . WISDOM TOOTH EXTRACTION      Current Outpatient Medications  Medication Sig Dispense Refill  . FLUoxetine (PROZAC) 20 MG tablet Take 2 tablets (40 mg total) by mouth daily. 180 tablet 0  . norethindrone-ethinyl estradiol (JUNEL FE 1/20) 1-20 MG-MCG tablet Take 1 tablet by mouth daily. 84 tablet 3   No current facility-administered medications for this visit.     History reviewed. No pertinent family history.  Review of Systems  All  other systems reviewed and are negative.   Exam:   BP 118/62   Pulse 92   Temp 98.1 F (36.7 C)   Ht 5' 2.5" (1.588 m)   Wt 114 lb (51.7 kg)   LMP 06/18/2019   SpO2 98%   BMI 20.52 kg/m   Weight change: @WEIGHTCHANGE @ Height:   Height: 5' 2.5" (158.8 cm)  Ht Readings from Last 3 Encounters:  07/04/19 5' 2.5" (1.588 m)  06/28/18 5\' 4"  (1.626 m)  06/01/17 5' 2.5" (1.588 m)    General appearance: alert, cooperative and appears stated age Head: Normocephalic, without obvious abnormality, atraumatic Neck: no adenopathy, supple, symmetrical, trachea midline and thyroid normal to inspection and palpation Lungs: clear to auscultation bilaterally Cardiovascular: regular rate and rhythm Breasts: no distinct lump noted, slight increase in nodularity of the left breast at 12 o'clock near the periphery. No other nodularity or lumps notes. Abdomen: soft, non-tender; non distended,  no masses,  no organomegaly Extremities: extremities normal, atraumatic, no cyanosis or edema Skin: Skin color, texture, turgor normal. No rashes or lesions Lymph nodes: Cervical, supraclavicular, and axillary nodes normal. No abnormal inguinal nodes palpated Neurologic: Grossly normal   Pelvic: External genitalia:  no lesions              Urethra:  normal appearing urethra with no masses, tenderness or lesions              Bartholins and Skenes: normal  Vagina: normal appearing vagina with normal color and discharge, no lesions              Cervix: no lesions               Bimanual Exam:  Uterus:  normal size, contour, position, consistency, mobility, non-tender and retroverted              Adnexa: no mass, fullness, tenderness               Rectovaginal: Confirms               Anus:  normal sphincter tone, no lesions  Chaperone was present for exam.  A:  Well Woman with normal exam  Left breast fibroadenoma  P:   No pap this year  Discussed breast self exam  Discussed calcium and vit  D intake  F/U breast ultrasound due, she will schedule  Continue OCP's  She will call if she wants the mirena or kyleena IUD  Screening labs  STD testing

## 2019-07-04 ENCOUNTER — Encounter: Payer: Self-pay | Admitting: Obstetrics and Gynecology

## 2019-07-04 ENCOUNTER — Ambulatory Visit (INDEPENDENT_AMBULATORY_CARE_PROVIDER_SITE_OTHER): Payer: No Typology Code available for payment source | Admitting: Obstetrics and Gynecology

## 2019-07-04 ENCOUNTER — Other Ambulatory Visit: Payer: Self-pay

## 2019-07-04 VITALS — BP 118/62 | HR 92 | Temp 98.1°F | Ht 62.5 in | Wt 114.0 lb

## 2019-07-04 DIAGNOSIS — Z0000 Encounter for general adult medical examination without abnormal findings: Secondary | ICD-10-CM

## 2019-07-04 DIAGNOSIS — Z01419 Encounter for gynecological examination (general) (routine) without abnormal findings: Secondary | ICD-10-CM | POA: Diagnosis not present

## 2019-07-04 DIAGNOSIS — Z113 Encounter for screening for infections with a predominantly sexual mode of transmission: Secondary | ICD-10-CM | POA: Diagnosis not present

## 2019-07-04 DIAGNOSIS — Z3009 Encounter for other general counseling and advice on contraception: Secondary | ICD-10-CM

## 2019-07-04 DIAGNOSIS — Z3041 Encounter for surveillance of contraceptive pills: Secondary | ICD-10-CM

## 2019-07-04 MED ORDER — NORETHIN ACE-ETH ESTRAD-FE 1-20 MG-MCG PO TABS: 1.0000 | ORAL_TABLET | Freq: Every day | ORAL | 3 refills | Status: DC

## 2019-07-04 NOTE — Patient Instructions (Signed)
EXERCISE AND DIET:  We recommended that you start or continue a regular exercise program for good health. Regular exercise means any activity that makes your heart beat faster and makes you sweat.  We recommend exercising at least 30 minutes per day at least 3 days a week, preferably 4 or 5.  We also recommend a diet low in fat and sugar.  Inactivity, poor dietary choices and obesity can cause diabetes, heart attack, stroke, and kidney damage, among others.    ALCOHOL AND SMOKING:  Women should limit their alcohol intake to no more than 7 drinks/beers/glasses of wine (combined, not each!) per week. Moderation of alcohol intake to this level decreases your risk of breast cancer and liver damage. And of course, no recreational drugs are part of a healthy lifestyle.  And absolutely no smoking or even second hand smoke. Most people know smoking can cause heart and lung diseases, but did you know it also contributes to weakening of your bones? Aging of your skin?  Yellowing of your teeth and nails?  CALCIUM AND VITAMIN D:  Adequate intake of calcium and Vitamin D are recommended.  The recommendations for exact amounts of these supplements seem to change often, but generally speaking 1,000 mg of calcium (between diet and supplement) and 800 units of Vitamin D per day seems prudent. Certain women may benefit from higher intake of Vitamin D.  If you are among these women, your doctor will have told you during your visit.    PAP SMEARS:  Pap smears, to check for cervical cancer or precancers,  have traditionally been done yearly, although recent scientific advances have shown that most women can have pap smears less often.  However, every woman still should have a physical exam from her gynecologist every year. It will include a breast check, inspection of the vulva and vagina to check for abnormal growths or skin changes, a visual exam of the cervix, and then an exam to evaluate the size and shape of the uterus and  ovaries.  And after 24 years of age, a rectal exam is indicated to check for rectal cancers. We will also provide age appropriate advice regarding health maintenance, like when you should have certain vaccines, screening for sexually transmitted diseases, bone density testing, colonoscopy, mammograms, etc.   MAMMOGRAMS:  All women over 40 years old should have a yearly mammogram. Many facilities now offer a "3D" mammogram, which may cost around $50 extra out of pocket. If possible,  we recommend you accept the option to have the 3D mammogram performed.  It both reduces the number of women who will be called back for extra views which then turn out to be normal, and it is better than the routine mammogram at detecting truly abnormal areas.    COLON CANCER SCREENING: Now recommend starting at age 45. At this time colonoscopy is not covered for routine screening until 50. There are take home tests that can be done between 45-49.   COLONOSCOPY:  Colonoscopy to screen for colon cancer is recommended for all women at age 50.  We know, you hate the idea of the prep.  We agree, BUT, having colon cancer and not knowing it is worse!!  Colon cancer so often starts as a polyp that can be seen and removed at colonscopy, which can quite literally save your life!  And if your first colonoscopy is normal and you have no family history of colon cancer, most women don't have to have it again for   10 years.  Once every ten years, you can do something that may end up saving your life, right?  We will be happy to help you get it scheduled when you are ready.  Be sure to check your insurance coverage so you understand how much it will cost.  It may be covered as a preventative service at no cost, but you should check your particular policy.      Breast Self-Awareness Breast self-awareness means being familiar with how your breasts look and feel. It involves checking your breasts regularly and reporting any changes to your  health care provider. Practicing breast self-awareness is important. A change in your breasts can be a sign of a serious medical problem. Being familiar with how your breasts look and feel allows you to find any problems early, when treatment is more likely to be successful. All women should practice breast self-awareness, including women who have had breast implants. How to do a breast self-exam One way to learn what is normal for your breasts and whether your breasts are changing is to do a breast self-exam. To do a breast self-exam: Look for Changes  1. Remove all the clothing above your waist. 2. Stand in front of a mirror in a room with good lighting. 3. Put your hands on your hips. 4. Push your hands firmly downward. 5. Compare your breasts in the mirror. Look for differences between them (asymmetry), such as: ? Differences in shape. ? Differences in size. ? Puckers, dips, and bumps in one breast and not the other. 6. Look at each breast for changes in your skin, such as: ? Redness. ? Scaly areas. 7. Look for changes in your nipples, such as: ? Discharge. ? Bleeding. ? Dimpling. ? Redness. ? A change in position. Feel for Changes Carefully feel your breasts for lumps and changes. It is best to do this while lying on your back on the floor and again while sitting or standing in the shower or tub with soapy water on your skin. Feel each breast in the following way:  Place the arm on the side of the breast you are examining above your head.  Feel your breast with the other hand.  Start in the nipple area and make  inch (2 cm) overlapping circles to feel your breast. Use the pads of your three middle fingers to do this. Apply light pressure, then medium pressure, then firm pressure. The light pressure will allow you to feel the tissue closest to the skin. The medium pressure will allow you to feel the tissue that is a little deeper. The firm pressure will allow you to feel the tissue  close to the ribs.  Continue the overlapping circles, moving downward over the breast until you feel your ribs below your breast.  Move one finger-width toward the center of the body. Continue to use the  inch (2 cm) overlapping circles to feel your breast as you move slowly up toward your collarbone.  Continue the up and down exam using all three pressures until you reach your armpit.  Write Down What You Find  Write down what is normal for each breast and any changes that you find. Keep a written record with breast changes or normal findings for each breast. By writing this information down, you do not need to depend only on memory for size, tenderness, or location. Write down where you are in your menstrual cycle, if you are still menstruating. If you are having trouble noticing differences   in your breasts, do not get discouraged. With time you will become more familiar with the variations in your breasts and more comfortable with the exam. How often should I examine my breasts? Examine your breasts every month. If you are breastfeeding, the best time to examine your breasts is after a feeding or after using a breast pump. If you menstruate, the best time to examine your breasts is 5-7 days after your period is over. During your period, your breasts are lumpier, and it may be more difficult to notice changes. When should I see my health care provider? See your health care provider if you notice:  A change in shape or size of your breasts or nipples.  A change in the skin of your breast or nipples, such as a reddened or scaly area.  Unusual discharge from your nipples.  A lump or thick area that was not there before.  Pain in your breasts.  Anything that concerns you.  

## 2019-07-05 LAB — LIPID PANEL
Chol/HDL Ratio: 2 ratio (ref 0.0–4.4)
Cholesterol, Total: 156 mg/dL (ref 100–199)
HDL: 77 mg/dL (ref >39)
LDL Chol Calc (NIH): 66 mg/dL (ref 0–99)
Triglycerides: 64 mg/dL (ref 0–149)
VLDL Cholesterol Cal: 13 mg/dL (ref 5–40)

## 2019-07-05 LAB — COMPREHENSIVE METABOLIC PANEL
ALT: 26 IU/L (ref 0–32)
AST: 20 IU/L (ref 0–40)
Albumin/Globulin Ratio: 2.4 — ABNORMAL HIGH (ref 1.2–2.2)
Albumin: 4.5 g/dL (ref 3.9–5.0)
Alkaline Phosphatase: 43 IU/L (ref 39–117)
BUN/Creatinine Ratio: 13 (ref 9–23)
BUN: 8 mg/dL (ref 6–20)
Bilirubin Total: 0.3 mg/dL (ref 0.0–1.2)
CO2: 26 mmol/L (ref 20–29)
Calcium: 9.5 mg/dL (ref 8.7–10.2)
Chloride: 103 mmol/L (ref 96–106)
Creatinine, Ser: 0.6 mg/dL (ref 0.57–1.00)
GFR calc Af Amer: 148 mL/min/{1.73_m2} (ref >59)
GFR calc non Af Amer: 128 mL/min/{1.73_m2} (ref >59)
Globulin, Total: 1.9 g/dL (ref 1.5–4.5)
Glucose: 107 mg/dL — ABNORMAL HIGH (ref 65–99)
Potassium: 3.8 mmol/L (ref 3.5–5.2)
Sodium: 140 mmol/L (ref 134–144)
Total Protein: 6.4 g/dL (ref 6.0–8.5)

## 2019-07-05 LAB — CBC
Hematocrit: 38.6 % (ref 34.0–46.6)
Hemoglobin: 13.1 g/dL (ref 11.1–15.9)
MCH: 31.1 pg (ref 26.6–33.0)
MCHC: 33.9 g/dL (ref 31.5–35.7)
MCV: 92 fL (ref 79–97)
Platelets: 269 10*3/uL (ref 150–450)
RBC: 4.21 x10E6/uL (ref 3.77–5.28)
RDW: 11.7 % (ref 11.7–15.4)
WBC: 5.5 10*3/uL (ref 3.4–10.8)

## 2019-07-05 LAB — RPR: RPR Ser Ql: NONREACTIVE

## 2019-07-05 LAB — HIV ANTIBODY (ROUTINE TESTING W REFLEX): HIV Screen 4th Generation wRfx: NONREACTIVE

## 2019-07-08 LAB — CHLAMYDIA/GONOCOCCUS/TRICHOMONAS, NAA: Trich vag by NAA: NEGATIVE

## 2019-07-10 ENCOUNTER — Telehealth: Payer: Self-pay | Admitting: Obstetrics and Gynecology

## 2019-07-10 ENCOUNTER — Other Ambulatory Visit: Payer: Self-pay

## 2019-07-10 ENCOUNTER — Ambulatory Visit (INDEPENDENT_AMBULATORY_CARE_PROVIDER_SITE_OTHER): Payer: No Typology Code available for payment source | Admitting: Obstetrics and Gynecology

## 2019-07-10 ENCOUNTER — Encounter: Payer: Self-pay | Admitting: Obstetrics and Gynecology

## 2019-07-10 VITALS — BP 108/68 | HR 72 | Temp 97.2°F | Wt 114.4 lb

## 2019-07-10 DIAGNOSIS — Z113 Encounter for screening for infections with a predominantly sexual mode of transmission: Secondary | ICD-10-CM | POA: Diagnosis not present

## 2019-07-10 NOTE — Telephone Encounter (Signed)
Spoke with patient. Patient request to proceed with [facility] IUD insertion discussed at last AEX. LMP 06/16/19. OCP for contraceptive.   IUD insertion scheduled for 07/17/19 at 4pm with Dr. Talbert Nan. Order placed for precert. Advised patient to take Motrin 800 mg with food and water one hour before procedure.   GC/Ch pending, will notify of results and recommendations. Continue OCP until IUD insertion.    Routing to provider for final review. Patient is agreeable to disposition. Will close encounter.  Cc: Lerry Liner, Magdalene Patricia

## 2019-07-10 NOTE — Telephone Encounter (Signed)
Left message to call Sharee Pimple, RN at [place].    AEX 07/04/19, IUD discussed with Dr. Talbert Nan OV 07/10/19: GC/Ch pending

## 2019-07-10 NOTE — Telephone Encounter (Signed)
Patient would like to discuss kyleena insertion.

## 2019-07-10 NOTE — Progress Notes (Signed)
GYNECOLOGY  VISIT   HPI: 23 y.o.   Single White or Caucasian Not Hispanic or Latino  female   G0P0000 with Patient's last menstrual period was 06/18/2019.   here for retest of GC/CHL.    GYNECOLOGIC HISTORY: Patient's last menstrual period was 06/18/2019. Contraception:OCP Menopausal hormone therapy: None        OB History    Gravida  0   Para  0   Term  0   Preterm  0   AB  0   Living  0     SAB  0   TAB  0   Ectopic  0   Multiple  0   Live Births  0              Patient Active Problem List   Diagnosis Date Noted  . OCD (obsessive compulsive disorder) 04/24/2019  . GAD (generalized anxiety disorder) 02/22/2019    Past Medical History:  Diagnosis Date  . Depression     Past Surgical History:  Procedure Laterality Date  . WISDOM TOOTH EXTRACTION      Current Outpatient Medications  Medication Sig Dispense Refill  . FLUoxetine (PROZAC) 20 MG tablet Take 2 tablets (40 mg total) by mouth daily. 180 tablet 0  . norethindrone-ethinyl estradiol (JUNEL FE 1/20) 1-20 MG-MCG tablet Take 1 tablet by mouth daily. 84 tablet 3   No current facility-administered medications for this visit.      ALLERGIES: Patient has no known allergies.  History reviewed. No pertinent family history.  Social History   Socioeconomic History  . Marital status: Single    Spouse name: Not on file  . Number of children: 0  . Years of education: Not on file  . Highest education level: Bachelor's degree (e.g., BA, AB, BS)  Occupational History  . Not on file  Social Needs  . Financial resource strain: Not on file  . Food insecurity    Worry: Not on file    Inability: Not on file  . Transportation needs    Medical: Not on file    Non-medical: Not on file  Tobacco Use  . Smoking status: Never Smoker  . Smokeless tobacco: Never Used  Substance and Sexual Activity  . Alcohol use: Yes    Alcohol/week: 3.0 standard drinks    Types: 3 Standard drinks or equivalent per  week  . Drug use: No  . Sexual activity: Yes    Partners: Male    Birth control/protection: Pill  Lifestyle  . Physical activity    Days per week: Not on file    Minutes per session: Not on file  . Stress: Not on file  Relationships  . Social Musician on phone: Not on file    Gets together: Not on file    Attends religious service: Not on file    Active member of club or organization: Not on file    Attends meetings of clubs or organizations: Not on file    Relationship status: Not on file  . Intimate partner violence    Fear of current or ex partner: Not on file    Emotionally abused: Not on file    Physically abused: Not on file    Forced sexual activity: Not on file  Other Topics Concern  . Not on file  Social History Narrative  . Not on file    Review of Systems  Constitutional: Negative.   HENT: Negative.   Eyes: Negative.  Respiratory: Negative.   Cardiovascular: Negative.   Gastrointestinal: Negative.   Genitourinary: Negative.   Musculoskeletal: Negative.   Skin: Negative.   Neurological: Negative.   Psychiatric/Behavioral: Negative.     PHYSICAL EXAMINATION:    BP 108/68 (BP Location: Right Arm, Patient Position: Sitting, Cuff Size: Normal)   Pulse 72   Temp (!) 97.2 F (36.2 C) (Skin)   Wt 114 lb 6.4 oz (51.9 kg)   LMP 06/18/2019   BMI 20.59 kg/m     General appearance: alert, cooperative and appears stated age  Pelvic: External genitalia:  no lesions              Urethra:  normal appearing urethra with no masses, tenderness or lesions              Bartholins and Skenes: normal                 Vagina: normal appearing vagina with normal color and discharge, no lesions              Cervix: no lesions  Chaperone was present for exam.  ASSESSMENT Screening STD, prior swab couldn't be processed    PLAN nuswab sent   An After Visit Summary was printed and given to the patient.

## 2019-07-11 ENCOUNTER — Other Ambulatory Visit: Payer: Self-pay | Admitting: *Deleted

## 2019-07-11 ENCOUNTER — Telehealth: Payer: Self-pay | Admitting: Obstetrics and Gynecology

## 2019-07-11 DIAGNOSIS — Z30014 Encounter for initial prescription of intrauterine contraceptive device: Secondary | ICD-10-CM

## 2019-07-11 NOTE — Telephone Encounter (Signed)
Call placed to convey benefits for [facility]. Spoke with the patient and conveyed the benefit. Patient understands/agreeable with the benefits. Patient is aware of the cancellation policy. Appointment scheduled 07/17/19.

## 2019-07-13 LAB — GC/CHLAMYDIA PROBE AMP
Chlamydia trachomatis, NAA: NEGATIVE
Neisseria Gonorrhoeae by PCR: NEGATIVE

## 2019-07-17 ENCOUNTER — Ambulatory Visit: Payer: Self-pay

## 2019-07-17 ENCOUNTER — Encounter: Payer: Self-pay | Admitting: Obstetrics and Gynecology

## 2019-07-17 ENCOUNTER — Other Ambulatory Visit: Payer: Self-pay

## 2019-07-17 ENCOUNTER — Ambulatory Visit (INDEPENDENT_AMBULATORY_CARE_PROVIDER_SITE_OTHER): Payer: No Typology Code available for payment source | Admitting: Obstetrics and Gynecology

## 2019-07-17 VITALS — BP 122/68 | HR 84 | Temp 97.3°F | Wt 115.6 lb

## 2019-07-17 DIAGNOSIS — Z30014 Encounter for initial prescription of intrauterine contraceptive device: Secondary | ICD-10-CM | POA: Diagnosis not present

## 2019-07-17 DIAGNOSIS — Z01812 Encounter for preprocedural laboratory examination: Secondary | ICD-10-CM

## 2019-07-17 LAB — POCT URINE PREGNANCY: Preg Test, Ur: NEGATIVE

## 2019-07-17 NOTE — Progress Notes (Signed)
GYNECOLOGY  VISIT   HPI: 23 y.o.   Single White or Caucasian Not Hispanic or Latino  female   G0P0000 with Patient's last menstrual period was 07/17/2019 (exact date).   here for [facility] insertion.  Recent negative STD testing  GYNECOLOGIC HISTORY: Patient's last menstrual period was 07/17/2019 (exact date). Contraception: OCP Menopausal hormone therapy: None        OB History    Gravida  0   Para  0   Term  0   Preterm  0   AB  0   Living  0     SAB  0   TAB  0   Ectopic  0   Multiple  0   Live Births  0              Patient Active Problem List   Diagnosis Date Noted  . OCD (obsessive compulsive disorder) 04/24/2019  . GAD (generalized anxiety disorder) 02/22/2019    Past Medical History:  Diagnosis Date  . Depression     Past Surgical History:  Procedure Laterality Date  . WISDOM TOOTH EXTRACTION      Current Outpatient Medications  Medication Sig Dispense Refill  . FLUoxetine (PROZAC) 20 MG tablet Take 2 tablets (40 mg total) by mouth daily. 180 tablet 0  . norethindrone-ethinyl estradiol (JUNEL FE 1/20) 1-20 MG-MCG tablet Take 1 tablet by mouth daily. 84 tablet 3   No current facility-administered medications for this visit.      ALLERGIES: Patient has no known allergies.  History reviewed. No pertinent family history.  Social History   Socioeconomic History  . Marital status: Single    Spouse name: Not on file  . Number of children: 0  . Years of education: Not on file  . Highest education level: Bachelor's degree (e.g., BA, AB, BS)  Occupational History  . Not on file  Social Needs  . Financial resource strain: Not on file  . Food insecurity    Worry: Not on file    Inability: Not on file  . Transportation needs    Medical: Not on file    Non-medical: Not on file  Tobacco Use  . Smoking status: Never Smoker  . Smokeless tobacco: Never Used  Substance and Sexual Activity  . Alcohol use: Yes    Alcohol/week: 3.0 standard  drinks    Types: 3 Standard drinks or equivalent per week  . Drug use: No  . Sexual activity: Yes    Partners: Male    Birth control/protection: Pill  Lifestyle  . Physical activity    Days per week: Not on file    Minutes per session: Not on file  . Stress: Not on file  Relationships  . Social Musician on phone: Not on file    Gets together: Not on file    Attends religious service: Not on file    Active member of club or organization: Not on file    Attends meetings of clubs or organizations: Not on file    Relationship status: Not on file  . Intimate partner violence    Fear of current or ex partner: Not on file    Emotionally abused: Not on file    Physically abused: Not on file    Forced sexual activity: Not on file  Other Topics Concern  . Not on file  Social History Narrative  . Not on file    Review of Systems  Constitutional: Negative.  HENT: Negative.   Eyes: Negative.   Respiratory: Negative.   Cardiovascular: Negative.   Gastrointestinal: Negative.   Genitourinary: Negative.  Negative for hematuria.  Musculoskeletal: Negative.   Skin: Negative.   Neurological: Negative.   Endo/Heme/Allergies: Negative.   Psychiatric/Behavioral: Negative.     PHYSICAL EXAMINATION:    BP 122/68 (BP Location: Right Arm, Patient Position: Sitting, Cuff Size: Normal)   Pulse 84   Temp (!) 97.3 F (36.3 C) (Skin)   Wt 115 lb 9.6 oz (52.4 kg)   LMP 07/17/2019 (Exact Date)   BMI 20.81 kg/m     General appearance: alert, cooperative and appears stated age  Pelvic: External genitalia:  no lesions              Urethra:  normal appearing urethra with no masses, tenderness or lesions              Bartholins and Skenes: normal                 Vagina: normal appearing vagina with normal color and discharge, no lesions              Cervix:  no lesions  The risks of the kyleena IUD were reviewed with the patient, including infection, abnormal bleeding and uterine  perfortion. Consent was signed.  A speculum was placed in the vagina, the cervix was cleansed with betadine. A tenaculum was placed on the cervix, the uterus sounded to 7-8 cm. The cervix was easily dilated to a 4 hagar dilator  The kyleena IUD was inserted without difficulty. The string were cut to 2-3 cm. The tenaculum was removed. Slight oozing from the tenaculum site was stopped with pressure.   The patient tolerated the procedure well.    Chaperone was present for exam.  ASSESSMENT Kyleena IUD insertion    PLAN F/U in one month, call with any concerns   An After Visit Summary was printed and given to the patient.

## 2019-07-17 NOTE — Patient Instructions (Signed)
IUD Post-procedure Instructions . Cramping is common.  You may take Ibuprofen, Aleve, or Tylenol for the cramping.  This should resolve within 24 hours.   . You may have a small amount of spotting.  You should wear a mini pad for the next few days. . You may have intercourse in 24 hours. . You need to call the office if you have any pelvic pain, fever, heavy bleeding, or foul smelling vaginal discharge. . Shower or bathe as normal . Use back up contraception for one week . Use condoms for STD protection  

## 2019-08-14 ENCOUNTER — Other Ambulatory Visit: Payer: Self-pay

## 2019-08-14 NOTE — Progress Notes (Signed)
GYNECOLOGY  VISIT   HPI: 23 y.o.   Single White or Caucasian Not Hispanic or Latino  female   G0P0000 with Patient's last menstrual period was 07/17/2019 (exact date).   here for 1 month Kyleena IUD check. She has had light daily spotting since IUD insertion, getting lighter. No cycle since the IUD was inserted. No cramping. Not sexually active since insertion.     GYNECOLOGIC HISTORY: Patient's last menstrual period was 07/17/2019 (exact date). Contraception: IUD Menopausal hormone therapy: None        OB History    Gravida  0   Para  0   Term  0   Preterm  0   AB  0   Living  0     SAB  0   TAB  0   Ectopic  0   Multiple  0   Live Births  0              Patient Active Problem List   Diagnosis Date Noted  . OCD (obsessive compulsive disorder) 04/24/2019  . GAD (generalized anxiety disorder) 02/22/2019    Past Medical History:  Diagnosis Date  . Depression     Past Surgical History:  Procedure Laterality Date  . WISDOM TOOTH EXTRACTION      Current Outpatient Medications  Medication Sig Dispense Refill  . FLUoxetine (PROZAC) 20 MG tablet Take 2 tablets (40 mg total) by mouth daily. 180 tablet 0  . Levonorgestrel (KYLEENA) 19.5 MG IUD by Intrauterine route.     No current facility-administered medications for this visit.     ALLERGIES: Patient has no known allergies.  History reviewed. No pertinent family history.  Social History   Socioeconomic History  . Marital status: Single    Spouse name: Not on file  . Number of children: 0  . Years of education: Not on file  . Highest education level: Bachelor's degree (e.g., BA, AB, BS)  Occupational History  . Not on file  Tobacco Use  . Smoking status: Never Smoker  . Smokeless tobacco: Never Used  Substance and Sexual Activity  . Alcohol use: Yes    Alcohol/week: 3.0 standard drinks    Types: 3 Standard drinks or equivalent per week  . Drug use: No  . Sexual activity: Yes    Partners:  Male    Birth control/protection: I.U.D.  Other Topics Concern  . Not on file  Social History Narrative  . Not on file   Social Determinants of Health   Financial Resource Strain:   . Difficulty of Paying Living Expenses: Not asked  Food Insecurity:   . Worried About Charity fundraiser in the Last Year: Not asked  . Ran Out of Food in the Last Year: Not asked  Transportation Needs:   . Lack of Transportation (Medical): Not on file  . Lack of Transportation (Non-Medical): Not on file  Physical Activity:   . Days of Exercise per Week: Not on file  . Minutes of Exercise per Session: Not on file  Stress:   . Feeling of Stress : Not on file  Social Connections:   . Frequency of Communication with Friends and Family: Not on file  . Frequency of Social Gatherings with Friends and Family: Not on file  . Attends Religious Services: Not on file  . Active Member of Clubs or Organizations: Not on file  . Attends Archivist Meetings: Not on file  . Marital Status: Not on file  Intimate  Partner Violence:   . Fear of Current or Ex-Partner: Not on file  . Emotionally Abused: Not on file  . Physically Abused: Not on file  . Sexually Abused: Not on file    Review of Systems  Constitutional: Negative.   HENT: Negative.   Eyes: Negative.   Respiratory: Negative.   Cardiovascular: Negative.   Gastrointestinal: Negative.   Genitourinary:       Spotting with IUD  Musculoskeletal: Negative.   Skin: Negative.   Neurological: Negative.   Endo/Heme/Allergies: Negative.   Psychiatric/Behavioral: Negative.     PHYSICAL EXAMINATION:    BP 114/62 (BP Location: Right Arm, Patient Position: Sitting, Cuff Size: Normal)   Pulse 72   Temp (!) 97 F (36.1 C) (Skin)   Wt 114 lb 3.2 oz (51.8 kg)   LMP 07/17/2019 (Exact Date)   BMI 20.55 kg/m     General appearance: alert, cooperative and appears stated age  Pelvic: External genitalia:  no lesions              Urethra:  normal  appearing urethra with no masses, tenderness or lesions              Bartholins and Skenes: normal                 Vagina: normal appearing vagina with normal color and discharge, no lesions              Cervix: no lesions and IUD string 3 cm              Bimanual Exam:  Uterus:  normal size, contour, position, consistency, mobility, non-tender              Adnexa: no mass, fullness, tenderness               Chaperone was present for exam.  ASSESSMENT IUD check, doing well    PLAN F/U next year for annual exam   An After Visit Summary was printed and given to the patient.

## 2019-08-15 ENCOUNTER — Ambulatory Visit (INDEPENDENT_AMBULATORY_CARE_PROVIDER_SITE_OTHER): Payer: No Typology Code available for payment source | Admitting: Obstetrics and Gynecology

## 2019-08-15 ENCOUNTER — Encounter: Payer: Self-pay | Admitting: Obstetrics and Gynecology

## 2019-08-15 VITALS — BP 114/62 | HR 72 | Temp 97.0°F | Wt 114.2 lb

## 2019-08-15 DIAGNOSIS — Z30431 Encounter for routine checking of intrauterine contraceptive device: Secondary | ICD-10-CM

## 2019-08-16 ENCOUNTER — Other Ambulatory Visit: Payer: Self-pay | Admitting: Obstetrics and Gynecology

## 2019-09-12 ENCOUNTER — Other Ambulatory Visit (HOSPITAL_COMMUNITY): Payer: Self-pay | Admitting: *Deleted

## 2020-04-28 ENCOUNTER — Other Ambulatory Visit: Payer: Self-pay

## 2020-04-28 ENCOUNTER — Telehealth (INDEPENDENT_AMBULATORY_CARE_PROVIDER_SITE_OTHER): Payer: PRIVATE HEALTH INSURANCE | Admitting: Psychiatry

## 2020-04-28 DIAGNOSIS — F422 Mixed obsessional thoughts and acts: Secondary | ICD-10-CM

## 2020-04-28 MED ORDER — FLUOXETINE HCL 20 MG PO CAPS: 20.0000 mg | ORAL_CAPSULE | Freq: Every day | ORAL | 0 refills | Status: DC

## 2020-04-28 NOTE — Progress Notes (Signed)
BH MD/PA/NP OP Progress Note  04/28/2020 8:11 AM Rosaisela Jamroz  MRN:  [id number] Interview was conducted by phone and I verified that I was speaking with the correct person using two identifiers. I discussed the limitations of evaluation and management by telemedicine and  the availability of in person appointments. Patient expressed understanding and agreed to proceed. Patient location - home; physician - home office.  Chief Complaint: Anxiety.  HPI: 24 yo single female who comes for a follow up appointment after a one year hiatus. She has a few year hx of excessive worrying, ruminating, difficulty with making decisions, disturbed sleep and feelings of derealization. These symptoms started when she was in 1 st year in college admittedly a stressful time for her. Although they improved some with therapy current high level of stress associated with her job appears to trigger them again. She had tried a low dose of escitalopram w/o much benefit in the past. While she continued to obsess and engage in compulsive gestures (eg touching objects few times to decrease anxiety).  She is physically healthy and very insightful. We have added fluoxetine 20 mg (then increased to 40 mg) which she tolerated well and reports improvement is symptoms. She eventually stopped taking it about 6 moths ago and now has decided that OCD sx are disruptive enough that she would like to resume taking fluoxetine. Since last time we spoke Rosmary moved back to [place] and now works for [employer]. There is no depression, sleep and appetite are good.   Visit Diagnosis:    ICD-10-CM   1. Mixed obsessional thoughts and acts  F42.2     Past Psychiatric History: Please see intake H&P.  Past Medical History:  Past Medical History:  Diagnosis Date  . Depression     Past Surgical History:  Procedure Laterality Date  . WISDOM TOOTH EXTRACTION      Family Psychiatric History: None.  Family History: No family  history on file.  Social History:  Social History   Socioeconomic History  . Marital status: Single    Spouse name: Not on file  . Number of children: 0  . Years of education: Not on file  . Highest education level: Bachelor's degree (e.g., BA, AB, BS)  Occupational History  . Not on file  Tobacco Use  . Smoking status: Never Smoker  . Smokeless tobacco: Never Used  Vaping Use  . Vaping Use: Never used  Substance and Sexual Activity  . Alcohol use: Yes    Alcohol/week: 3.0 standard drinks    Types: 3 Standard drinks or equivalent per week  . Drug use: No  . Sexual activity: Yes    Partners: Male    Birth control/protection: I.U.D.  Other Topics Concern  . Not on file  Social History Narrative  . Not on file   Social Determinants of Health   Financial Resource Strain:   . Difficulty of Paying Living Expenses: Not on file  Food Insecurity:   . Worried About Programme researcher, broadcasting/film/video in the Last Year: Not on file  . Ran Out of Food in the Last Year: Not on file  Transportation Needs:   . Lack of Transportation (Medical): Not on file  . Lack of Transportation (Non-Medical): Not on file  Physical Activity:   . Days of Exercise per Week: Not on file  . Minutes of Exercise per Session: Not on file  Stress:   . Feeling of Stress : Not on file  Social Connections:   .  Frequency of Communication with Friends and Family: Not on file  . Frequency of Social Gatherings with Friends and Family: Not on file  . Attends Religious Services: Not on file  . Active Member of Clubs or Organizations: Not on file  . Attends Banker Meetings: Not on file  . Marital Status: Not on file    Allergies: No Known Allergies  Metabolic Disorder Labs: No results found for: HGBA1C, MPG No results found for: PROLACTIN Lab Results  Component Value Date   CHOL 156 07/04/2019   TRIG 64 07/04/2019   HDL 77 07/04/2019   CHOLHDL 2.0 07/04/2019   LDLCALC 66 07/04/2019   LDLCALC 77  06/01/2017   No results found for: TSH  Therapeutic Level Labs: No results found for: LITHIUM No results found for: VALPROATE No components found for:  CBMZ  Current Medications: Current Outpatient Medications  Medication Sig Dispense Refill  . FLUoxetine (PROZAC) 20 MG capsule Take 1 capsule (20 mg total) by mouth daily. 90 capsule 0  . Levonorgestrel (KYLEENA) 19.5 MG IUD by Intrauterine route.     No current facility-administered medications for this visit.     Psychiatric Specialty Exam: Review of Systems  Psychiatric/Behavioral: The patient is nervous/anxious.   All other systems reviewed and are negative.   There were no vitals taken for this visit.There is no height or weight on file to calculate BMI.  General Appearance: NA  Eye Contact:  NA  Speech:  Clear and Coherent  Volume:  Normal  Mood:  Anxious  Affect:  NA  Thought Process:  Goal Directed  Orientation:  Full (Time, Place, and Person)  Thought Content: Obsessions   Suicidal Thoughts:  No  Homicidal Thoughts:  No  Memory:  Immediate;   Good Recent;   Good Remote;   Good  Judgement:  Good  Insight:  Good  Psychomotor Activity:  NA  Concentration:  Concentration: Good  Recall:  Good  Fund of Knowledge: Good  Language: Good  Akathisia:  Negative  Handed:  Right  AIMS (if indicated): not done  Assets:  Communication Skills Desire for Improvement Financial Resources/Insurance Housing Physical Health Social Support Talents/Skills  ADL's:  Intact  Cognition: WNL  Sleep:  Good    Assessment and Plan: 24 yo single single female who comes for a follow up appointment after a one year hiatus. She has a few year hx of excessive worrying, ruminating, difficulty with making decisions, disturbed sleep and feelings of derealization. These symptoms started when she was in 1 st year in college admittedly a stressful time for her. Although they improved some with therapy current high level of stress associated with  her job appears to trigger them again. She had tried a low dose of escitalopram w/o much benefit in the past. While she continued to obsess and engage in compulsive gestures (eg touching objects few times to decrease anxiety).  She is physically healthy and very insightful. We have added fluoxetine 20 mg (then increased to 40 mg) which she tolerated well and reports improvement is symptoms. She eventually stopped taking it about 6 moths ago and now has decided that OCD sx are disruptive enough that she would like to resume taking fluoxetine. Since last time we spoke Maheen moved back to [place] and now works for [employer]. There is no depression, sleep and appetite are good.   Dx: OCD  Plan: We will restart fluoxetine 20  g daily and meet in two months to decide if  she would like to increase dose. Analyse may need even higher dose to achieve sufficient control of obsessions/compulsions. The plan was discussed with patient who had an opportunity to ask questions and these were all answered.Next appointment in 2 months. I have spend 15 min in phone consultation with the patient.    Magdalene Patricia, MD 04/28/2020, 8:11 AM

## 2020-06-24 ENCOUNTER — Telehealth (INDEPENDENT_AMBULATORY_CARE_PROVIDER_SITE_OTHER): Payer: PRIVATE HEALTH INSURANCE | Admitting: Psychiatry

## 2020-06-24 ENCOUNTER — Other Ambulatory Visit: Payer: Self-pay

## 2020-06-24 ENCOUNTER — Encounter (HOSPITAL_COMMUNITY): Payer: Self-pay | Admitting: Psychiatry

## 2020-06-24 DIAGNOSIS — F422 Mixed obsessional thoughts and acts: Secondary | ICD-10-CM | POA: Diagnosis not present

## 2020-06-24 MED ORDER — FLUOXETINE HCL 20 MG PO CAPS: 20.0000 mg | ORAL_CAPSULE | Freq: Every day | ORAL | 1 refills | Status: DC

## 2020-06-24 NOTE — Progress Notes (Signed)
BH MD/PA/NP OP Progress Note  06/24/2020 8:06 AM Charlene Cole  MRN:  [id number] Interview was conducted by phone and I verified that I was speaking with the correct person using two identifiers. I discussed the limitations of evaluation and management by telemedicine and  the availability of in person appointments. Patient expressed understanding and agreed to proceed. Patient location - home; physician - home office.  Chief Complaint: "I am doing well".  HPI: 24 yo single female with a few year hx of excessive worrying, ruminating, difficulty with making decisions, disturbed sleep and feelings of derealization. These symptoms started when she was in 1 st year in college admittedly a stressful time for her. Although they improved some with therapy current high level of stress associated with her job appears to trigger them again. She had tried a low dose of escitalopram w/o much benefit in the past. While she continued to obsess and engage in compulsive gestures (eg touching objects few times to decrease anxiety).She is physically healthy and very insightful. We have added fluoxetine 20 mg (then increased to 40 mg) which she tolerated well and reports improvement is symptoms. She eventually stopped taking it about 8 moths ago and in August decided that OCD sx became disruptive enough that she would like to resume taking fluoxetine. Since last time we spoke Charlene Cole moved back to [place] and now works remotely for [employer].There is no depression, sleep and appetite are good. Fluoxetine at 20 mg dose appears to work well enough ("Takes the edge off") and Charlene Cole does not feel there is need to increase dose further at this time.   Visit Diagnosis:    ICD-10-CM   1. Mixed obsessional thoughts and acts  F42.2     Past Psychiatric History: Please see intake H&P.  Past Medical History:  Past Medical History:  Diagnosis Date  . Depression     Past Surgical History:   Procedure Laterality Date  . WISDOM TOOTH EXTRACTION      Family Psychiatric History: None.  Family History: History reviewed. No pertinent family history.  Social History:  Social History   Socioeconomic History  . Marital status: Single    Spouse name: Not on file  . Number of children: 0  . Years of education: Not on file  . Highest education level: Bachelor's degree (e.g., BA, AB, BS)  Occupational History  . Not on file  Tobacco Use  . Smoking status: Never Smoker  . Smokeless tobacco: Never Used  Vaping Use  . Vaping Use: Never used  Substance and Sexual Activity  . Alcohol use: Yes    Alcohol/week: 3.0 standard drinks    Types: 3 Standard drinks or equivalent per week  . Drug use: No  . Sexual activity: Yes    Partners: Male    Birth control/protection: I.U.D.  Other Topics Concern  . Not on file  Social History Narrative   Currently works for [employer].   Social Determinants of Health   Financial Resource Strain:   . Difficulty of Paying Living Expenses: Not on file  Food Insecurity:   . Worried About Programme researcher, broadcasting/film/video in the Last Year: Not on file  . Ran Out of Food in the Last Year: Not on file  Transportation Needs:   . Lack of Transportation (Medical): Not on file  . Lack of Transportation (Non-Medical): Not on file  Physical Activity:   . Days of Exercise per Week: Not on file  . Minutes  of Exercise per Session: Not on file  Stress:   . Feeling of Stress : Not on file  Social Connections:   . Frequency of Communication with Friends and Family: Not on file  . Frequency of Social Gatherings with Friends and Family: Not on file  . Attends Religious Services: Not on file  . Active Member of Clubs or Organizations: Not on file  . Attends Banker Meetings: Not on file  . Marital Status: Not on file    Allergies: No Known Allergies  Metabolic Disorder Labs: No results found for: HGBA1C, MPG No results found  for: PROLACTIN Lab Results  Component Value Date   CHOL 156 07/04/2019   TRIG 64 07/04/2019   HDL 77 07/04/2019   CHOLHDL 2.0 07/04/2019   LDLCALC 66 07/04/2019   LDLCALC 77 06/01/2017   No results found for: TSH  Therapeutic Level Labs: No results found for: LITHIUM No results found for: VALPROATE No components found for:  CBMZ  Current Medications: Current Outpatient Medications  Medication Sig Dispense Refill  . [START ON 07/26/2020] FLUoxetine (PROZAC) 20 MG capsule Take 1 capsule (20 mg total) by mouth daily. 90 capsule 1  . Levonorgestrel (KYLEENA) 19.5 MG IUD by Intrauterine route.     No current facility-administered medications for this visit.     Psychiatric Specialty Exam: Review of Systems  All other systems reviewed and are negative.   There were no vitals taken for this visit.There is no height or weight on file to calculate BMI.  General Appearance: NA  Eye Contact:  NA  Speech:  Clear and Coherent and Normal Rate  Volume:  Normal  Mood:  Euthymic  Affect:  NA  Thought Process:  Goal Directed  Orientation:  Full (Time, Place, and Person)  Thought Content: Rumination   Suicidal Thoughts:  No  Homicidal Thoughts:  No  Memory:  Immediate;   Good Recent;   Good Remote;   Good  Judgement:  Good  Insight:  Good  Psychomotor Activity:  NA  Concentration:  Concentration: Good  Recall:  Good  Fund of Knowledge: Good  Language: Good  Akathisia:  Negative  Handed:  Right  AIMS (if indicated): not done  Assets:  Communication Skills Desire for Improvement Financial Resources/Insurance Housing Physical Health Talents/Skills Vocational/Educational  ADL's:  Intact  Cognition: WNL  Sleep:  Good    Assessment and Plan: 24 yo single female with a few year hx of excessive worrying, ruminating, difficulty with making decisions, disturbed sleep and feelings of derealization. These symptoms started when she was in 1 st year in college admittedly a  stressful time for her. Although they improved some with therapy high level of stress associated with her job appeared to continue to trigger them. She had tried a low dose of escitalopram w/o much benefit in the past. While she continued to obsess and engage in compulsive gestures (eg touching objects few times to decrease anxiety).She is physically healthy and very insightful. We have added fluoxetine 20 mg (then increased to 40 mg) which she tolerated well and reports improvement is symptoms. She eventually stopped taking it about 8 moths ago and in August decided that OCD sx became disruptive enough that she would like to resume taking fluoxetine. Since last time we spoke Charlene Cole moved back to [place] and now works remotely for [employer].There is no depression, sleep and appetite are good. Fluoxetine at 20 mg dose appears to work well enough ("Takes the edge off")  and Charlene Cole does not feel there is need to increase dose further at this time.  Dx: OCD  Plan: We will continue fluoxetine 20 mg daily. The plan was discussed with patient who had an opportunity to ask questions and these were all answered.Next appointment in4 months. I have spend 15 min in phone consultation with the patient.   Magdalene Patricia, MD 06/24/2020, 8:06 AM

## 2020-07-01 NOTE — Progress Notes (Deleted)
24 y.o. G0P0000 Single White or Caucasian Not Hispanic or Latino female here for annual exam.      No LMP recorded. (Menstrual status: IUD).          Sexually active: {yes no:314532}  The current method of family planning is {contraception:315051}.    Exercising: {yes no:314532}  {types:19826} Smoker:  {YES [id number]  Health Maintenance: Pap:  06/01/2017 negative History of abnormal Pap:  no MMG:  Ultrasound left breast Bi-rads 3 probably benign  BMD:   NA  Colonoscopy: none  TDaP:  06/01/17  Gardasil: completed all 3    reports that she has never smoked. She has never used smokeless tobacco. She reports current alcohol use of about 3.0 standard drinks of alcohol per week. She reports that she does not use drugs.  Past Medical History:  Diagnosis Date  . Depression     Past Surgical History:  Procedure Laterality Date  . WISDOM TOOTH EXTRACTION      Current Outpatient Medications  Medication Sig Dispense Refill  . [START ON 07/26/2020] FLUoxetine (PROZAC) 20 MG capsule Take 1 capsule (20 mg total) by mouth daily. 90 capsule 1  . Levonorgestrel (KYLEENA) 19.5 MG IUD by Intrauterine route.     No current facility-administered medications for this visit.    No family history on file.  Review of Systems  Exam:   There were no vitals taken for this visit.  Weight change: @WEIGHTCHANGE @ Height:      Ht Readings from Last 3 Encounters:  07/04/19 5' 2.5" (1.588 m)  06/28/18 5\' 4"  (1.626 m)  06/01/17 5' 2.5" (1.588 m)    General appearance: alert, cooperative and appears stated age Head: Normocephalic, without obvious abnormality, atraumatic Neck: no adenopathy, supple, symmetrical, trachea midline and thyroid {CHL AMB PHY EX THYROID NORM DEFAULT:[phone]::"normal to inspection and palpation"} Lungs: clear to auscultation bilaterally Cardiovascular: regular rate and rhythm Breasts: {Exam; breast:13139::"normal appearance, no masses or tenderness"} Abdomen: soft,  non-tender; non distended,  no masses,  no organomegaly Extremities: extremities normal, atraumatic, no cyanosis or edema Skin: Skin color, texture, turgor normal. No rashes or lesions Lymph nodes: Cervical, supraclavicular, and axillary nodes normal. No abnormal inguinal nodes palpated Neurologic: Grossly normal   Pelvic: External genitalia:  no lesions              Urethra:  normal appearing urethra with no masses, tenderness or lesions              Bartholins and Skenes: normal                 Vagina: normal appearing vagina with normal color and discharge, no lesions              Cervix: {CHL AMB PHY EX CERVIX NORM DEFAULT:[phone]::"no lesions"}               Bimanual Exam:  Uterus:  {CHL AMB PHY EX UTERUS NORM DEFAULT:[phone]::"normal size, contour, position, consistency, mobility, non-tender"}              Adnexa: {CHL AMB PHY EX ADNEXA NO MASS DEFAULT:[phone]::"no mass, fullness, tenderness"}               Rectovaginal: Confirms               Anus:  normal sphincter tone, no lesions  *** chaperoned for the exam.  A:  Well Woman with normal exam  P:

## 2020-07-07 ENCOUNTER — Ambulatory Visit: Payer: 59 | Admitting: Obstetrics and Gynecology

## 2020-07-07 ENCOUNTER — Telehealth: Payer: Self-pay

## 2020-07-07 NOTE — Telephone Encounter (Signed)
Patient cancelled AEX for today due to being sick.

## 2020-10-23 ENCOUNTER — Other Ambulatory Visit: Payer: Self-pay

## 2020-10-23 ENCOUNTER — Telehealth (INDEPENDENT_AMBULATORY_CARE_PROVIDER_SITE_OTHER): Payer: PRIVATE HEALTH INSURANCE | Admitting: Psychiatry

## 2020-10-23 DIAGNOSIS — F422 Mixed obsessional thoughts and acts: Secondary | ICD-10-CM | POA: Diagnosis not present

## 2020-10-23 MED ORDER — FLUOXETINE HCL 20 MG PO CAPS: 20.0000 mg | ORAL_CAPSULE | Freq: Every day | ORAL | 1 refills | Status: AC

## 2020-10-23 NOTE — Progress Notes (Signed)
BH MD/PA/NP OP Progress Note  10/23/2020 8:07 AM Charlene Cole  MRN:  [id number] Interview was conducted by phone and I verified that I was speaking with the correct person using two identifiers. I discussed the limitations of evaluation and management by telemedicine and  the availability of in person appointments. Patient expressed understanding and agreed to proceed. Participants in the visit: patient (location - home); physician (location - home office).  Chief Complaint: "I am doing well".  HPI: 25yo single female with afew year hx of excessive worrying, ruminating, difficulty with making decisions, disturbed sleep and feelings of derealization. These symptoms started when she was in 1 st year in college admittedly a stressful time for her. Although they improved some with therapy high level of stress associated with her job appeared to continue to trigger them. She had tried a low dose of escitalopram w/o much benefit in the past. While she continued toobsessandengagein compulsive gestures (eg touching objects few times to decrease anxiety).She is physically healthy and very insightful. We haveadded fluoxetine 20 mg (then increased to 40 mg) which she tolerated well and reported improvement in symptoms of OCD. She eventually stopped taking it for a few months last year but gradually her symptoms returned and became disruptive enough that she decided to resume taking fluoxetine. Charlene Cole now lives in [place]. There is no depression, sleep and appetite are good.Fluoxetine at 20 mg dose appears to work well enough and Charlene Cole does not feel there is need to increase dose further at this time.    Visit Diagnosis:    ICD-10-CM   1. Mixed obsessional thoughts and acts  F42.2     Past Psychiatric History: Please see intake H&P.  Past Medical History:  Past Medical History:  Diagnosis Date  . Depression     Past Surgical History:  Procedure Laterality Date  . WISDOM TOOTH EXTRACTION       Family Psychiatric History: None.  Family History: No family history on file.  Social History:  Social History   Socioeconomic History  . Marital status: Single    Spouse name: Not on file  . Number of children: 0  . Years of education: Not on file  . Highest education level: Bachelor's degree (e.g., BA, AB, BS)  Occupational History  . Not on file  Tobacco Use  . Smoking status: Never Smoker  . Smokeless tobacco: Never Used  Vaping Use  . Vaping Use: Never used  Substance and Sexual Activity  . Alcohol use: Yes    Alcohol/week: 3.0 standard drinks    Types: 3 Standard drinks or equivalent per week  . Drug use: No  . Sexual activity: Yes    Partners: Male    Birth control/protection: I.U.D.  Other Topics Concern  . Not on file  Social History Narrative   Currently works for [employer].   Social Determinants of Health   Financial Resource Strain: Not on file  Food Insecurity: Not on file  Transportation Needs: Not on file  Physical Activity: Not on file  Stress: Not on file  Social Connections: Not on file    Allergies: No Known Allergies  Metabolic Disorder Labs: No results found for: HGBA1C, MPG No results found for: PROLACTIN Lab Results  Component Value Date   CHOL 156 07/04/2019   TRIG 64 07/04/2019   HDL 77 07/04/2019   CHOLHDL 2.0 07/04/2019   LDLCALC 66 07/04/2019   LDLCALC 77 06/01/2017   No results found for: TSH  Therapeutic Level  Labs: No results found for: LITHIUM No results found for: VALPROATE No components found for:  CBMZ  Current Medications: Current Outpatient Medications  Medication Sig Dispense Refill  . [START ON 12/23/2020] FLUoxetine (PROZAC) 20 MG capsule Take 1 capsule (20 mg total) by mouth daily. 90 capsule 1  . Levonorgestrel (KYLEENA) 19.5 MG IUD by Intrauterine route.     No current facility-administered medications for this visit.      Psychiatric Specialty Exam: Review of Systems   All other systems reviewed and are negative.   There were no vitals taken for this visit.There is no height or weight on file to calculate BMI.  General Appearance: NA  Eye Contact:  NA  Speech:  Clear and Coherent and Normal Rate  Volume:  Normal  Mood:  Euthymic  Affect:  NA  Thought Process:  Goal Directed and Linear  Orientation:  Full (Time, Place, and Person)  Thought Content: Logical   Suicidal Thoughts:  No  Homicidal Thoughts:  No  Memory:  Immediate;   Good Recent;   Good Remote;   Good  Judgement:  Good  Insight:  Good  Psychomotor Activity:  NA  Concentration:  Concentration: Good  Recall:  Good  Fund of Knowledge: Good  Language: Good  Akathisia:  Negative  Handed:  Right  AIMS (if indicated): not done  Assets:  Communication Skills Desire for Improvement Financial Resources/Insurance Housing Physical Health Vocational/Educational  ADL's:  Intact  Cognition: WNL  Sleep:  Good   Assessment and Plan: 25yo single female with afew year hx of excessive worrying, ruminating, difficulty with making decisions, disturbed sleep and feelings of derealization. These symptoms started when she was in 1 st year in college admittedly a stressful time for her. Although they improved some with therapy high level of stress associated with her job appeared to continue to trigger them. She had tried a low dose of escitalopram w/o much benefit in the past. While she continued toobsessandengagein compulsive gestures (eg touching objects few times to decrease anxiety).She is physically healthy and very insightful. We haveadded fluoxetine 20 mg (then increased to 40 mg) which she tolerated well and reported improvement in symptoms. She eventually stopped taking it for a few months last year but gradually her symptoms returned and became disruptive enough that she decided to resume taking fluoxetine. Charlene Cole now lives in [place]. There is no depression, sleep and appetite are  good.Fluoxetine at 20 mg dose appears to work well enough and Charlene Cole does not feel there is need to increase dose further at this time.  Dx: OCD  Plan:We willcontinue fluoxetine 20 mg daily. The plan was discussed with patient who had an opportunity to ask questions and these were all answered.She will seek appointment with a new psychiatrist in the [place] area where she lives now. I have spend15 min in phone consultation with the patient.    Magdalene Patricia, MD 10/23/2020, 8:07 AM
